# Patient Record
Sex: Female | Born: 1980 | Race: White | Hispanic: No | Smoking: Former smoker
Health system: Southern US, Community
[De-identification: ages and names within clinical notes are randomized; demographics above are authoritative.]

## PROBLEM LIST (undated history)

## (undated) DIAGNOSIS — F988 Other specified behavioral and emotional disorders with onset usually occurring in childhood and adolescence: Secondary | ICD-10-CM

## (undated) DIAGNOSIS — F419 Anxiety disorder, unspecified: Secondary | ICD-10-CM

## (undated) DIAGNOSIS — R87610 Atypical squamous cells of undetermined significance on cytologic smear of cervix (ASC-US): Secondary | ICD-10-CM

## (undated) DIAGNOSIS — J309 Allergic rhinitis, unspecified: Secondary | ICD-10-CM

## (undated) DIAGNOSIS — F329 Major depressive disorder, single episode, unspecified: Secondary | ICD-10-CM

## (undated) DIAGNOSIS — F32A Depression, unspecified: Secondary | ICD-10-CM

## (undated) DIAGNOSIS — T7840XA Allergy, unspecified, initial encounter: Secondary | ICD-10-CM

## (undated) HISTORY — DX: Major depressive disorder, single episode, unspecified: F32.9

## (undated) HISTORY — DX: Anxiety disorder, unspecified: F41.9

## (undated) HISTORY — DX: Atypical squamous cells of undetermined significance on cytologic smear of cervix (ASC-US): R87.610

## (undated) HISTORY — DX: Allergy, unspecified, initial encounter: T78.40XA

## (undated) HISTORY — DX: Allergic rhinitis, unspecified: J30.9

## (undated) HISTORY — DX: Other specified behavioral and emotional disorders with onset usually occurring in childhood and adolescence: F98.8

## (undated) HISTORY — DX: Depression, unspecified: F32.A

---

## 2004-01-01 ENCOUNTER — Ambulatory Visit (HOSPITAL_COMMUNITY): Admission: RE | Admit: 2004-01-01 | Discharge: 2004-01-01 | Payer: Self-pay | Admitting: Gynecology

## 2004-05-19 ENCOUNTER — Inpatient Hospital Stay (HOSPITAL_COMMUNITY): Admission: AD | Admit: 2004-05-19 | Discharge: 2004-05-21 | Payer: Self-pay | Admitting: Gynecology

## 2006-07-19 ENCOUNTER — Ambulatory Visit: Payer: Self-pay | Admitting: Family Medicine

## 2006-07-19 ENCOUNTER — Encounter: Payer: Self-pay | Admitting: Family Medicine

## 2009-08-25 ENCOUNTER — Ambulatory Visit: Payer: Self-pay | Admitting: Family Medicine

## 2009-08-25 ENCOUNTER — Encounter: Payer: Self-pay | Admitting: Obstetrics and Gynecology

## 2009-08-25 LAB — CONVERTED CEMR LAB
Antibody Screen: NEGATIVE
Basophils Absolute: 0 10*3/uL (ref 0.0–0.1)
Basophils Relative: 0 % (ref 0–1)
Eosinophils Absolute: 0 10*3/uL (ref 0.0–0.7)
Eosinophils Relative: 1 % (ref 0–5)
HCT: 37.7 % (ref 36.0–46.0)
Hemoglobin: 12.3 g/dL (ref 12.0–15.0)
Hepatitis B Surface Ag: NEGATIVE
Lymphocytes Relative: 23 % (ref 12–46)
Lymphs Abs: 1.6 10*3/uL (ref 0.7–4.0)
MCHC: 32.6 g/dL (ref 30.0–36.0)
MCV: 92 fL (ref 78.0–100.0)
Monocytes Absolute: 0.4 10*3/uL (ref 0.1–1.0)
Monocytes Relative: 6 % (ref 3–12)
Neutro Abs: 4.8 10*3/uL (ref 1.7–7.7)
Neutrophils Relative %: 70 % (ref 43–77)
Platelets: 287 10*3/uL (ref 150–400)
RBC: 4.1 M/uL (ref 3.87–5.11)
RDW: 14 % (ref 11.5–15.5)
Rh Type: POSITIVE
Rubella: 32.9 intl units/mL — ABNORMAL HIGH
WBC: 7 10*3/uL (ref 4.0–10.5)

## 2009-08-27 ENCOUNTER — Ambulatory Visit (HOSPITAL_COMMUNITY): Admission: RE | Admit: 2009-08-27 | Discharge: 2009-08-27 | Payer: Self-pay | Admitting: Obstetrics and Gynecology

## 2009-09-08 ENCOUNTER — Ambulatory Visit: Payer: Self-pay | Admitting: Obstetrics and Gynecology

## 2009-09-17 ENCOUNTER — Ambulatory Visit (HOSPITAL_COMMUNITY): Admission: RE | Admit: 2009-09-17 | Discharge: 2009-09-17 | Payer: Self-pay | Admitting: Obstetrics and Gynecology

## 2009-10-01 ENCOUNTER — Ambulatory Visit: Payer: Self-pay | Admitting: Family Medicine

## 2009-10-08 ENCOUNTER — Ambulatory Visit (HOSPITAL_COMMUNITY): Admission: RE | Admit: 2009-10-08 | Discharge: 2009-10-08 | Payer: Self-pay | Admitting: Family Medicine

## 2009-11-04 ENCOUNTER — Ambulatory Visit: Payer: Self-pay | Admitting: Family Medicine

## 2009-11-14 ENCOUNTER — Encounter: Admission: RE | Admit: 2009-11-14 | Discharge: 2009-11-14 | Payer: Self-pay | Admitting: Surgery

## 2009-12-02 ENCOUNTER — Ambulatory Visit: Payer: Self-pay | Admitting: Family Medicine

## 2009-12-02 LAB — CONVERTED CEMR LAB
HCT: 32.8 % — ABNORMAL LOW (ref 36.0–46.0)
Hemoglobin: 10.4 g/dL — ABNORMAL LOW (ref 12.0–15.0)
MCHC: 31.7 g/dL (ref 30.0–36.0)
MCV: 94.5 fL (ref 78.0–100.0)
Platelets: 286 10*3/uL (ref 150–400)
RBC: 3.47 M/uL — ABNORMAL LOW (ref 3.87–5.11)
RDW: 14.2 % (ref 11.5–15.5)
WBC: 9.9 10*3/uL (ref 4.0–10.5)

## 2009-12-15 ENCOUNTER — Ambulatory Visit: Payer: Self-pay | Admitting: Obstetrics & Gynecology

## 2009-12-25 ENCOUNTER — Ambulatory Visit: Payer: Self-pay | Admitting: Obstetrics & Gynecology

## 2010-01-08 ENCOUNTER — Ambulatory Visit: Payer: Self-pay | Admitting: Obstetrics & Gynecology

## 2010-01-27 ENCOUNTER — Ambulatory Visit: Payer: Self-pay | Admitting: Obstetrics & Gynecology

## 2010-02-17 ENCOUNTER — Encounter: Payer: Self-pay | Admitting: Family Medicine

## 2010-02-17 ENCOUNTER — Ambulatory Visit: Payer: Self-pay | Admitting: Family Medicine

## 2010-02-17 LAB — CONVERTED CEMR LAB
Chlamydia, DNA Probe: NEGATIVE
GC Probe Amp, Genital: NEGATIVE

## 2010-02-18 ENCOUNTER — Encounter: Payer: Self-pay | Admitting: Family Medicine

## 2010-02-25 ENCOUNTER — Ambulatory Visit: Payer: Self-pay | Admitting: Obstetrics & Gynecology

## 2010-02-27 ENCOUNTER — Ambulatory Visit
Admission: RE | Admit: 2010-02-27 | Discharge: 2010-02-27 | Payer: Self-pay | Source: Home / Self Care | Attending: Obstetrics & Gynecology | Admitting: Obstetrics & Gynecology

## 2010-03-03 ENCOUNTER — Encounter: Payer: Self-pay | Admitting: Obstetrics & Gynecology

## 2010-03-03 ENCOUNTER — Ambulatory Visit
Admission: RE | Admit: 2010-03-03 | Discharge: 2010-03-03 | Payer: Self-pay | Source: Home / Self Care | Attending: Family Medicine | Admitting: Family Medicine

## 2010-03-03 LAB — CONVERTED CEMR LAB
ALT: 13 units/L (ref 0–35)
AST: 19 units/L (ref 0–37)
Albumin: 3.3 g/dL — ABNORMAL LOW (ref 3.5–5.2)
Alkaline Phosphatase: 102 units/L (ref 39–117)
BUN: 8 mg/dL (ref 6–23)
CO2: 23 meq/L (ref 19–32)
Calcium: 9.2 mg/dL (ref 8.4–10.5)
Chloride: 105 meq/L (ref 96–112)
Collection Interval-CRCL: 24 hr
Creatinine 24 HR UR: 1294 mg/24hr (ref 700–1800)
Creatinine Clearance: 145 mL/min — ABNORMAL HIGH (ref 75–115)
Creatinine, Ser: 0.62 mg/dL (ref 0.40–1.20)
Creatinine, Urine: 64.7 mg/dL
Glucose, Bld: 68 mg/dL — ABNORMAL LOW (ref 70–99)
HCT: 39.2 % (ref 36.0–46.0)
Hemoglobin: 11.6 g/dL — ABNORMAL LOW (ref 12.0–15.0)
LDH: 158 units/L (ref 94–250)
MCHC: 29.6 g/dL — ABNORMAL LOW (ref 30.0–36.0)
MCV: 104 fL — ABNORMAL HIGH (ref 78.0–100.0)
Platelets: 183 10*3/uL (ref 150–400)
Potassium: 4.5 meq/L (ref 3.5–5.3)
Protein, 24H Urine: 160 mg/24hr — ABNORMAL HIGH (ref 50–100)
RBC: 3.77 M/uL — ABNORMAL LOW (ref 3.87–5.11)
RDW: 15.2 % (ref 11.5–15.5)
Sodium: 137 meq/L (ref 135–145)
Total Bilirubin: 0.3 mg/dL (ref 0.3–1.2)
Total Protein: 6 g/dL (ref 6.0–8.3)
Uric Acid, Serum: 5.4 mg/dL (ref 2.4–7.0)
WBC: 8 10*3/uL (ref 4.0–10.5)

## 2010-03-10 ENCOUNTER — Ambulatory Visit: Admit: 2010-03-10 | Payer: Self-pay | Admitting: Family Medicine

## 2010-03-10 ENCOUNTER — Inpatient Hospital Stay (HOSPITAL_COMMUNITY)
Admission: AD | Admit: 2010-03-10 | Discharge: 2010-03-12 | Payer: Self-pay | Source: Home / Self Care | Attending: Obstetrics & Gynecology | Admitting: Obstetrics & Gynecology

## 2010-03-16 LAB — CBC
HCT: 36.3 % (ref 36.0–46.0)
HCT: 34.2 % — ABNORMAL LOW (ref 36.0–46.0)
Hemoglobin: 12.2 g/dL (ref 12.0–15.0)
Hemoglobin: 11.1 g/dL — ABNORMAL LOW (ref 12.0–15.0)
MCH: 30.7 pg (ref 26.0–34.0)
MCH: 30.7 pg (ref 26.0–34.0)
MCHC: 33.6 g/dL (ref 30.0–36.0)
MCHC: 32.5 g/dL (ref 30.0–36.0)
MCV: 91.2 fL (ref 78.0–100.0)
MCV: 94.7 fL (ref 78.0–100.0)
Platelets: 182 10*3/uL (ref 150–400)
Platelets: 157 10*3/uL (ref 150–400)
RBC: 3.98 MIL/uL (ref 3.87–5.11)
RBC: 3.61 MIL/uL — ABNORMAL LOW (ref 3.87–5.11)
RDW: 14 % (ref 11.5–15.5)
RDW: 14.1 % (ref 11.5–15.5)
WBC: 9 10*3/uL (ref 4.0–10.5)
WBC: 10.1 10*3/uL (ref 4.0–10.5)

## 2010-03-16 LAB — RPR: RPR Ser Ql: NONREACTIVE

## 2010-04-15 ENCOUNTER — Ambulatory Visit: Payer: Self-pay | Admitting: Obstetrics & Gynecology

## 2010-05-12 ENCOUNTER — Ambulatory Visit: Payer: Self-pay | Admitting: Obstetrics and Gynecology

## 2010-05-13 ENCOUNTER — Ambulatory Visit: Payer: BC Managed Care – PPO | Admitting: Obstetrics & Gynecology

## 2010-05-13 DIAGNOSIS — Z30430 Encounter for insertion of intrauterine contraceptive device: Secondary | ICD-10-CM

## 2010-05-22 NOTE — Assessment & Plan Note (Signed)
NAMERAKAYLA, RICKLEFS NO.:  [id number]  MEDICAL RECORD NO.:  [id number]           PATIENT TYPE:  LOCATION:  [facility] at [facility]           FACILITY:  PHYSICIAN:  Allie Bossier, MD        DATE OF BIRTH:  Mar 31, 1980  DATE OF SERVICE:  05/12/2010                                 CLINIC NOTE  Ms. Prentiss is a 30 year old G3, P3, now status post NSVD 9 weeks ago on March 10, 2010.  The baby is doing well.  She denies any symptoms of baby blues or depression.  She has not yet had sex.  For birth control, she is scheduled to get a Mirena IUD tomorrow.  She says that regarding her bladder for approximately 4 weeks postpartum had some stress incontinence, but now that is resolved.  She is currently doing Kegel exercises.  She says initially bowel function was complicated by constipation, but that is now resolved.  She is currently not taking any medication.  She has no complaints.  On exam, her vulva is well healed. Her bimanual exam reveals normal size and shape, anteverted, mobile uterus.  Her adnexa are nonenlarged and nontender.  ASSESSMENT AND PLAN:  Postpartum exam, doing well.  She will plan to get her Mirena tomorrow, and her Pap smear will be due again in October 2012.     Allie Bossier, MD    MCD/MEDQ  D:  05/12/2010  T:  05/13/2010  Job:  [id number]

## 2010-07-14 NOTE — Assessment & Plan Note (Signed)
NAME:  Stephanie Kane, Stephanie Kane      ACCOUNT NO.:  [id number]   MEDICAL RECORD NO.:  [id number]          PATIENT TYPE:  POB   LOCATION:  [facility] at [facility]         FACILITY:  [facility]   PHYSICIAN:  Tinnie Gens, MD        DATE OF BIRTH:  Mar 25, 1980   DATE OF SERVICE:  07/19/2006                                  CLINIC NOTE   CHIEF COMPLAINT:  The patient presents today for her annual exam.   HISTORY OF PRESENT ILLNESS:  The patient is a 30 year old female who has  no complaints today.  She does wish to have her Allegra prescription  refilled that she takes daily for her allergies, and she is interested  in returning to oral contraception.  She states that she had been on the  pill before, Ortho Tri-Cyclen, and developed headaches and she wishes to  be back home because her face is now breaking out and it was much better  on pills.   PAST MEDICAL HISTORY:  Past medical history is negative.   FAMILY MEDICAL HISTORY:  Maternal grandmother and maternal grandfather  have hypertension and treated with medications.   SOCIAL HISTORY:  [employer] patient is working in the [employer].  She is  a nonsmoker.  She drinks occasionally wine, and denies recreational drug  use.  She is married.   SURGICAL HISTORY:  Denies.   GYNECOLOGICAL HISTORY:  Menarche at age 89.  She has had a history of  abnormal genital cytology x2, approximately 7 to 8 years ago.  She  states that they thought one was because of bacterial vaginosis.  She  describes always having a vaginal discharge; however, it is not  associated with a foul odor.  She is gravida 1, para 2.  Her cycles are  every 28 days, lasting 4 to 5 days.  She also uses condoms for birth  control, but wishes to return to pills.   ALLERGIES:  SHE HAS NO ALLERGIES TO FOOD OR MEDICINES.   REVIEW OF SYSTEMS:  1. Positive for allergic rhinitis.  2. Acne, which she feels has worsened since going off birth control      pills.   PHYSICAL EXAMINATION:   GENERAL:  The patient is a well-developed, well-  nourished female in no acute distress.  VITAL SIGNS:  Pulse 92, blood pressure 133/73, her weight is 147.  Her  last menstrual period was 07/07/2006.  Her last Pap smear was  06/30/2004.  HEENT:  Negative.  CHEST:  Clear to auscultation and percussion.  HEART:  Rate and rhythm were regular, without murmur, gallop, or cardiac  enlargement.  BREASTS:  Bilaterally soft, without masses, nodes, or nipple discharge.  ABDOMEN:  Abdomen is soft and scaphoid, without masses or organomegaly.  PELVIC EXAMINATION:  External genitalia within normal limits for female.  The vagina is clean and rugose.  There is no evidence of abnormal  vaginal discharge at this time.  The cervix is parous and nontender.  The uterus is firm and normal shape, size, and contour.  Adnexa  bilaterally clear.  RECTOVAGINAL EXAMINATION:  Deferred.  EXTREMITIES:  Within normal limits.   IMPRESSION:  1. Normal gynecologic examination.  2. Allergic rhinitis, treated with Allegra.  3.  The patient's desire to return to oral contraception.   PLAN:  1. I have prescribed Alesse 120/28 mcg, one p.o. daily, one pack and      p.r.n. refills.  Have chosen this due to the lower estrogen to      hopefully avoid headaches.  The patient is to report any headaches      related to this pill.  2. Allegra D one p.o. b.i.d. p.r.n. for allergic rhinitis, dispensed      #60.  3. Pap smear results will be back in 1 to 2 weeks.  Will call patient      if the Pap smear is abnormal.  4. She is to return to the office in one year for her annual      examination.     ______________________________  Matt Holmes, N.P.    ______________________________  Tinnie Gens, MD    EMK/MEDQ  D:  07/19/2006  T:  07/19/2006  Job:  [id number]

## 2011-10-11 ENCOUNTER — Ambulatory Visit: Payer: Self-pay | Admitting: Family Medicine

## 2012-05-25 IMAGING — US US OB DETAIL+14 WK
1 series · 14 of 28 positions shown · non-contrast
Comparison: none

OBSTETRICAL ULTRASOUND:
 This ultrasound exam was performed in the [HOSPITAL] Ultrasound Department.  The OB US report was generated in the AS system, and faxed to the ordering physician.  This report is also available in [HOSPITAL]?s [employer] and in [REDACTED] PACS.

[Series 1: us ob detail +14 wk · 100 acquisitions, 14 frames shown]
[im 4/100]
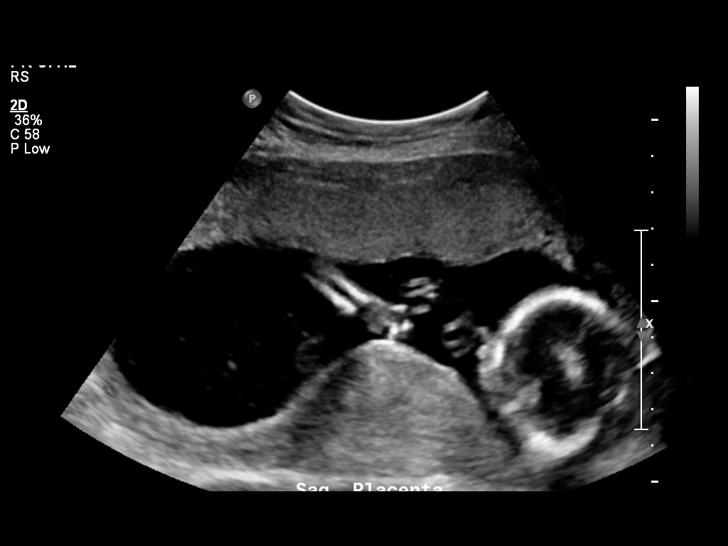
[im 12/100]
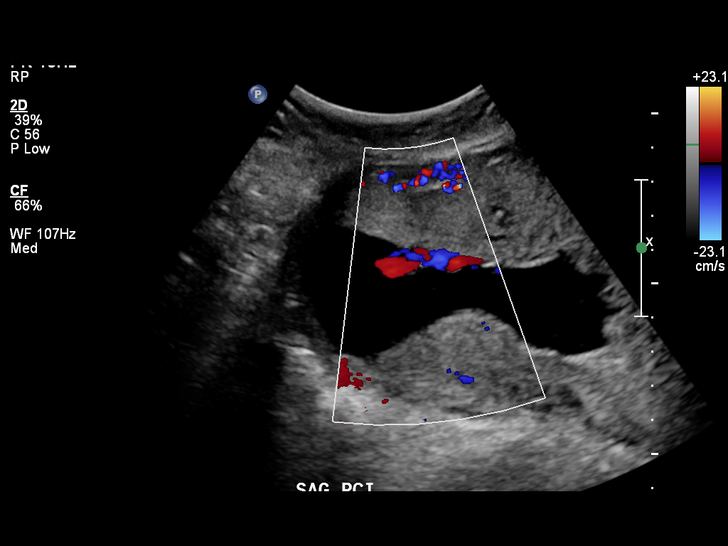
[im 19/100]
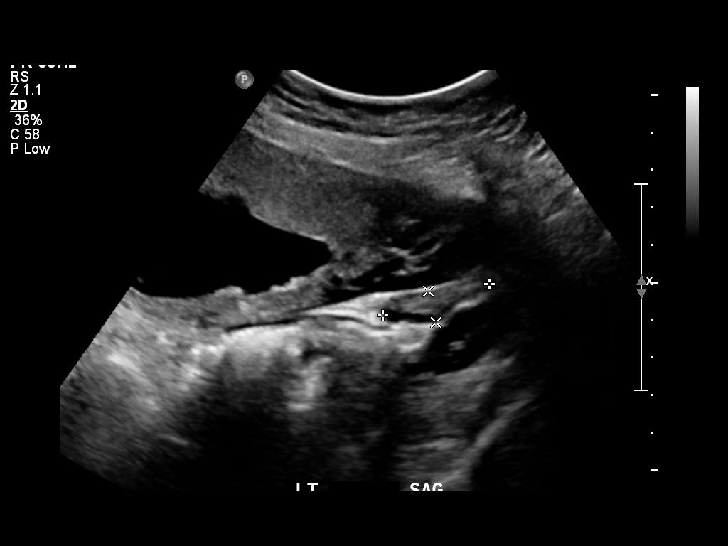
[im 26/100]
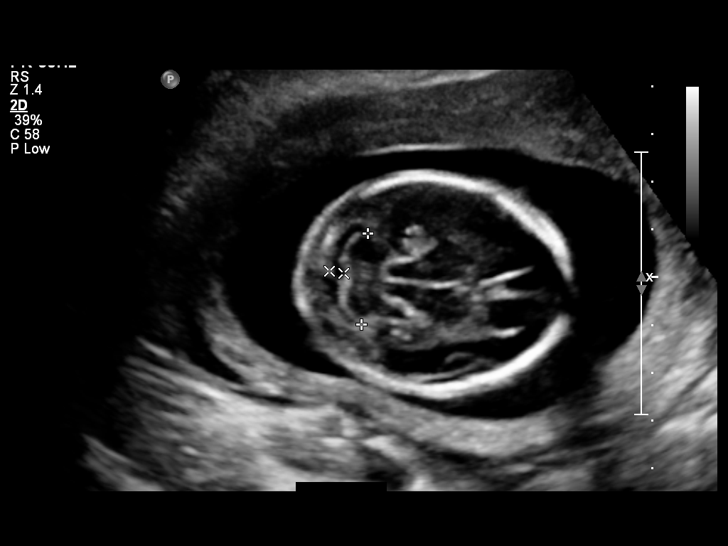
[im 34/100]
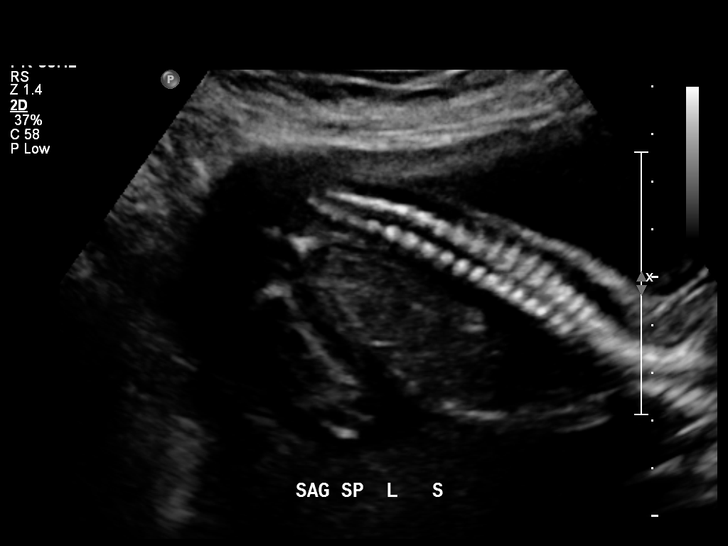
[im 41/100]
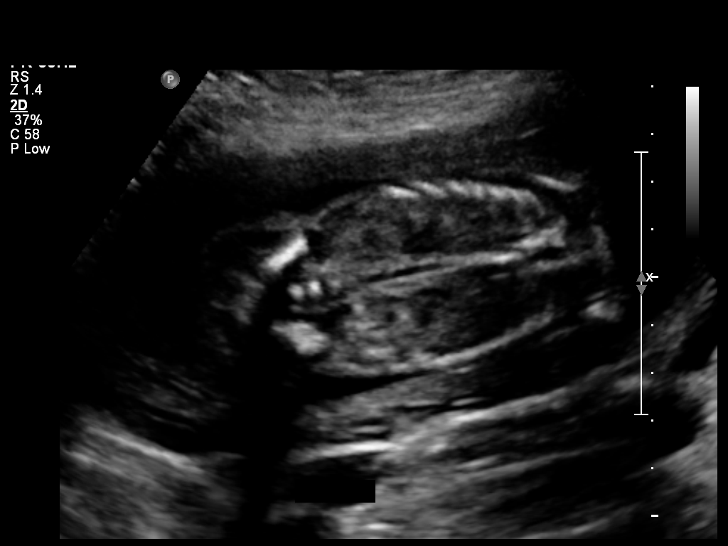
[im 48/100]
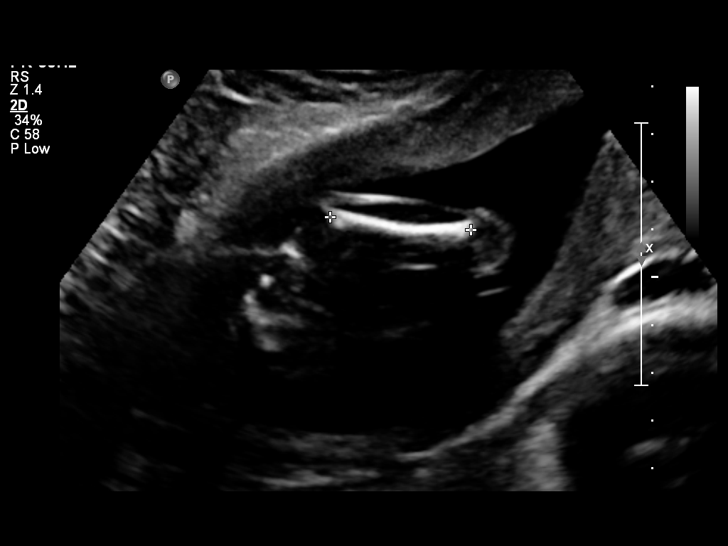
[im 56/100]
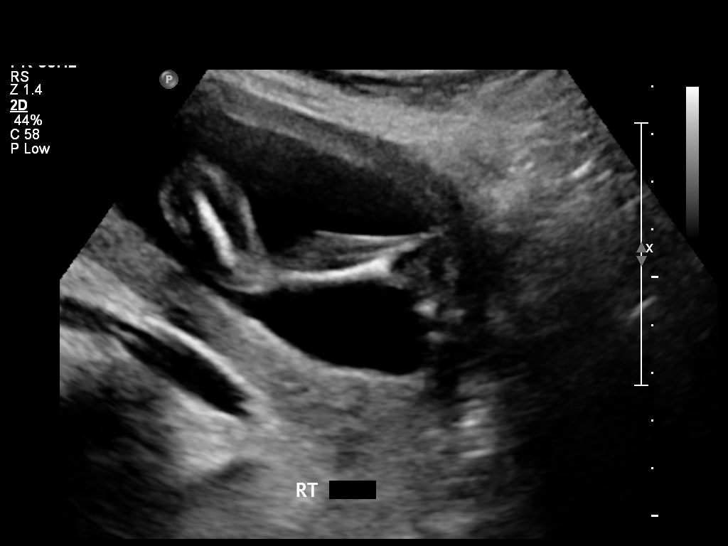
[im 63/100]
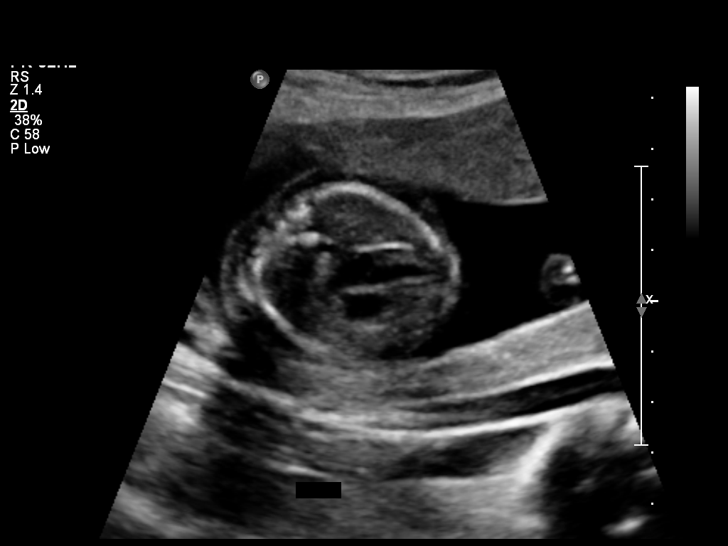
[im 70/100]
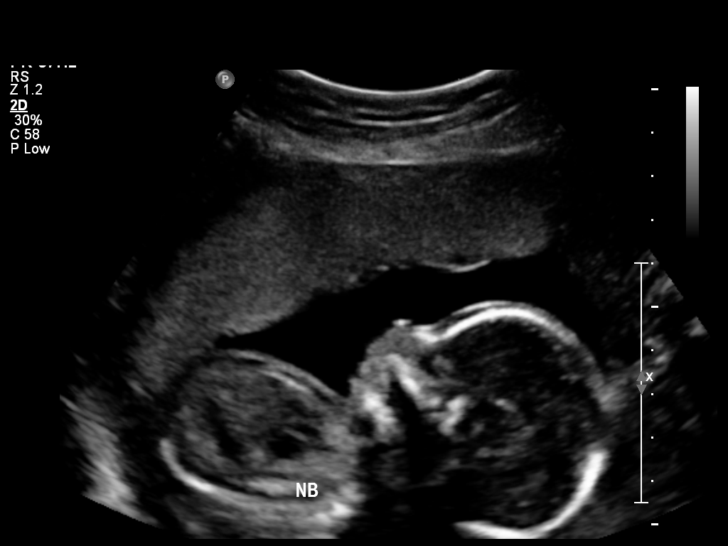
[im 78/100]
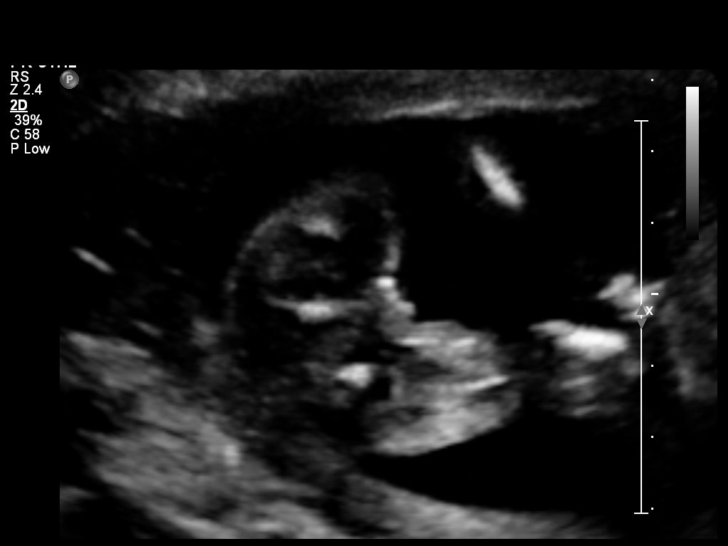
[im 85/100]
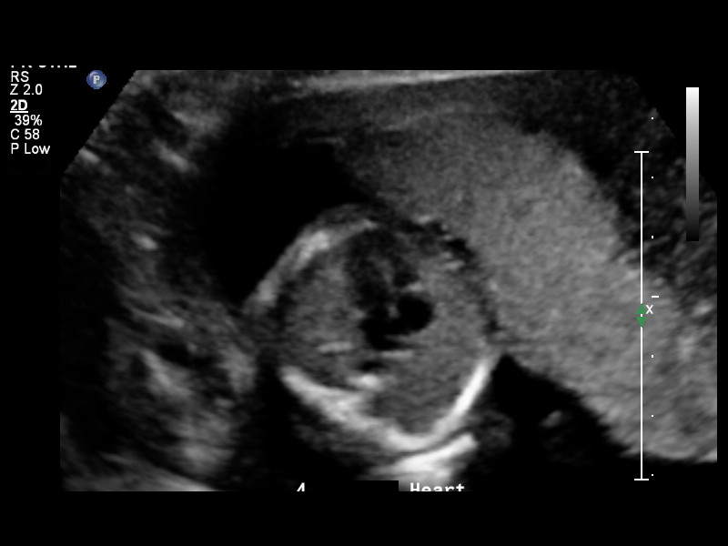
[im 92/100]
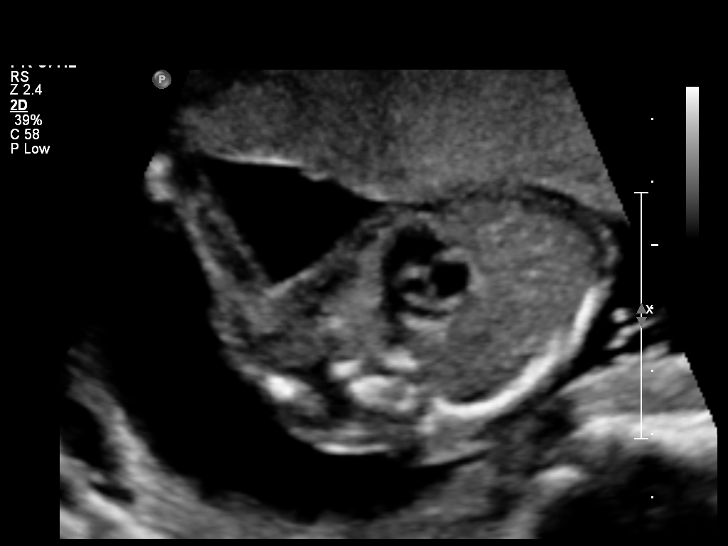
[im 100/100]
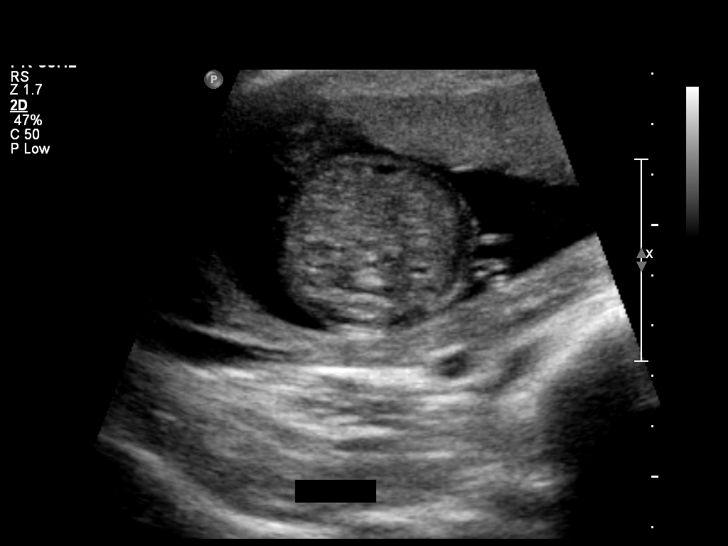

[14 of 28 positions shown; findings below may reference images not displayed]

IMPRESSION: See AS Obstetric US report.

## 2012-07-01 IMAGING — US US OUTSIDE FILMS BREAST
1 series · 4 of 4 positions shown · non-contrast
Comparison: none

[Series 1: us outside films breast · 4 of 4 slices shown]
[im 1/4]
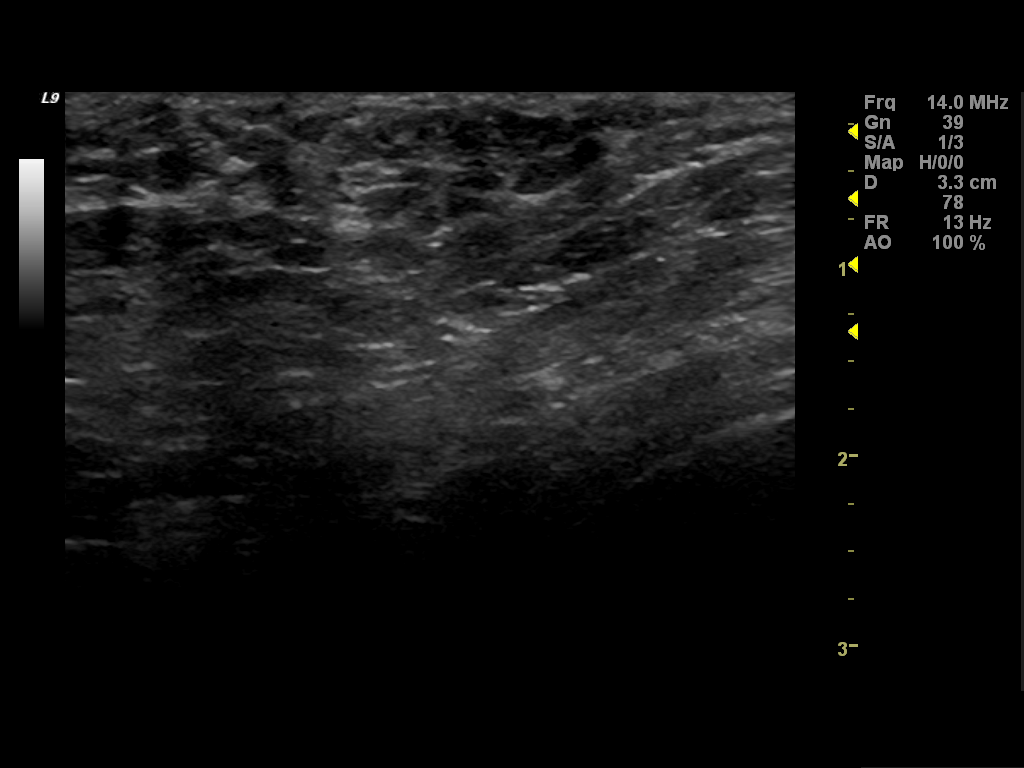
[im 2/4]
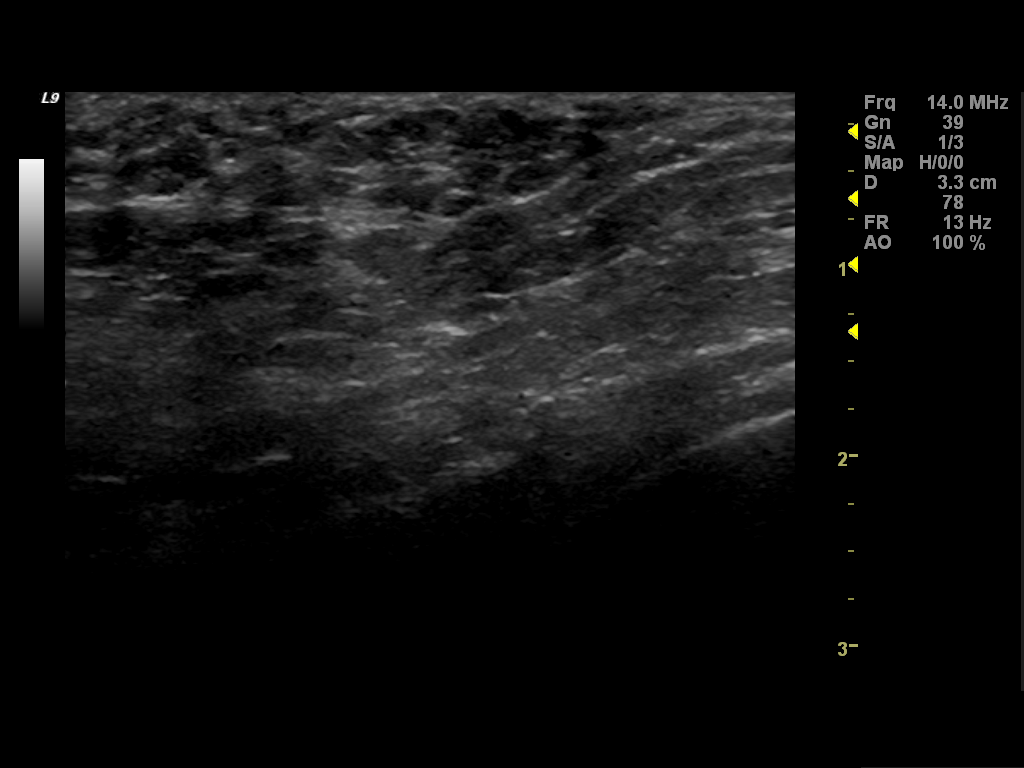
[im 3/4]
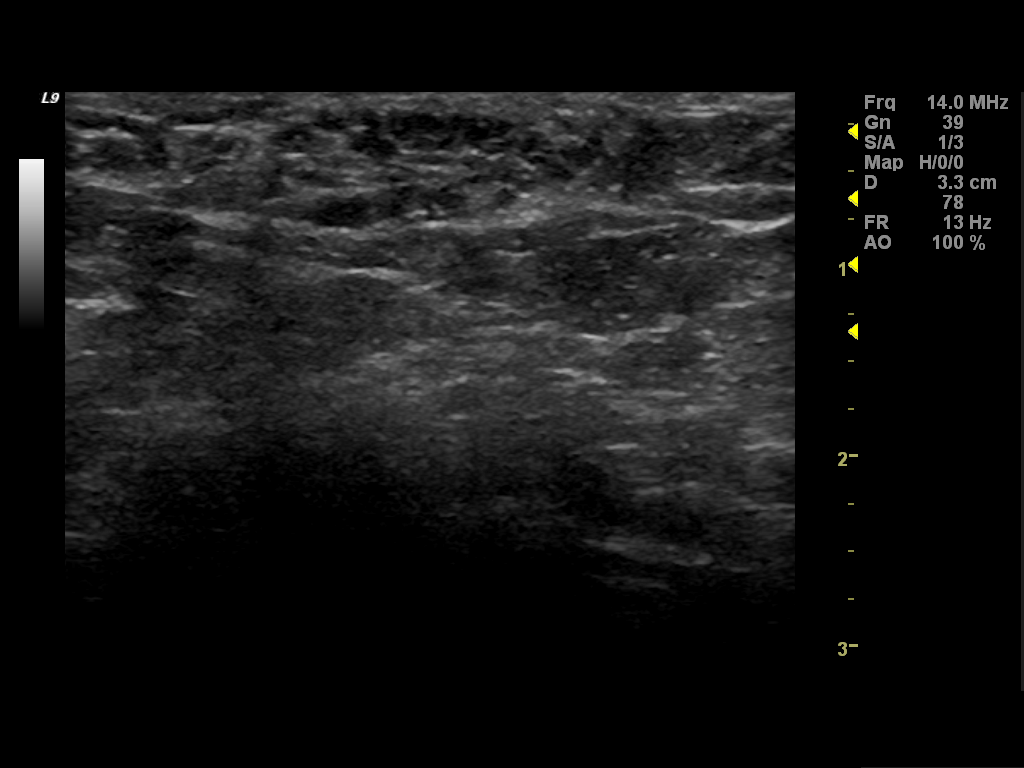
[im 4/4]
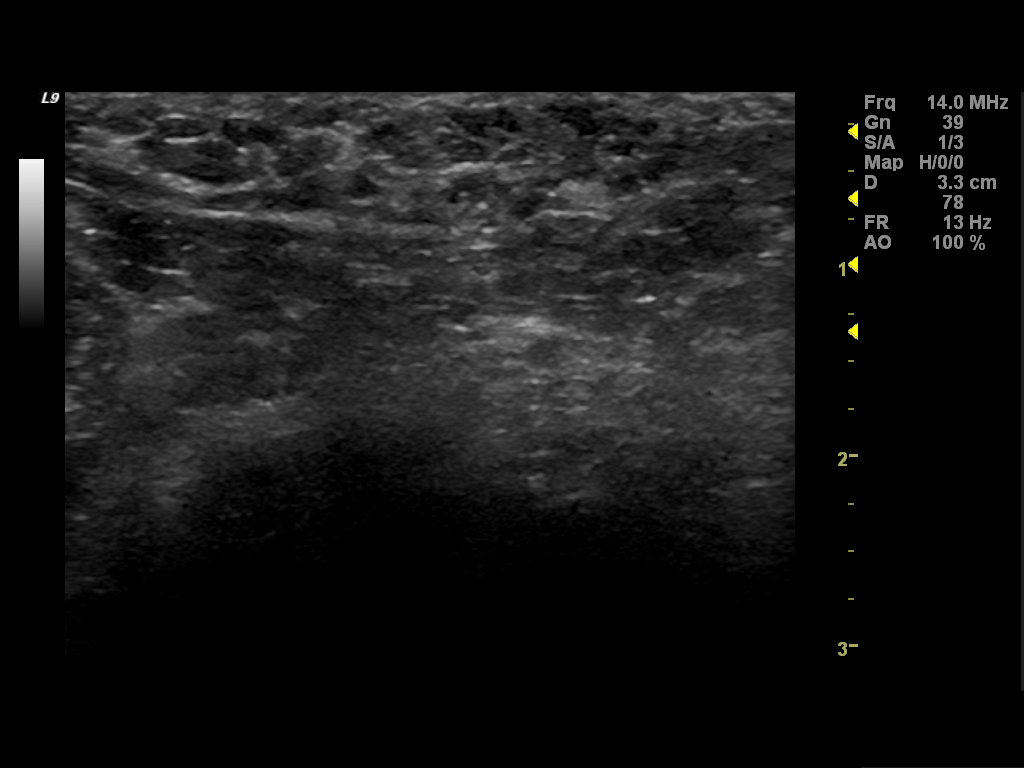

[4 of 4 positions shown; findings below may reference images not displayed]

IMAGES IMPORTED FROM THE [employer] WORKFLOW SYSTEM
NO DICTATION FOR STUDY

## 2013-03-07 ENCOUNTER — Ambulatory Visit: Payer: Self-pay | Admitting: Family Medicine

## 2014-05-28 IMAGING — US ULTRASOUND LEFT BREAST
1 series · 14 of 25 positions shown · non-contrast
Comparison: none

REASON FOR EXAM: LT BRST LUMP 2 OCLOCK AND ENLARGED AXILLARY LYMPH
BASELINE
COMMENTS:

[Series 1: ultrasound left breast · 0.08mm/px · 14 of 26 slices shown]
[im 1/26]
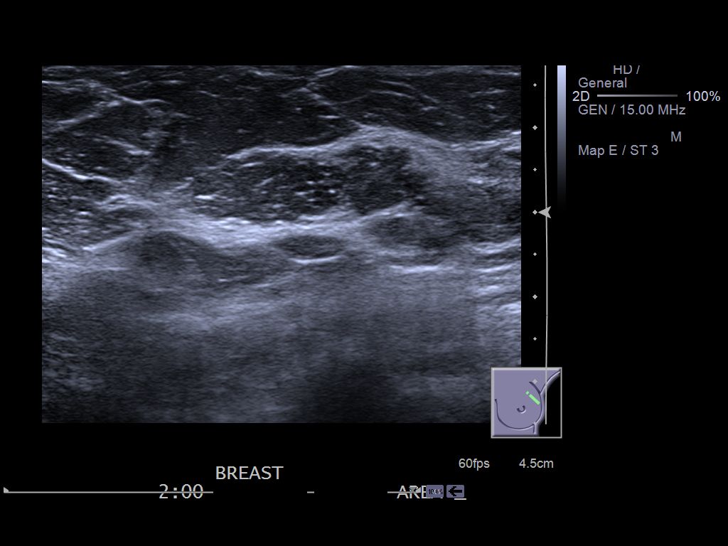
[im 3/26]
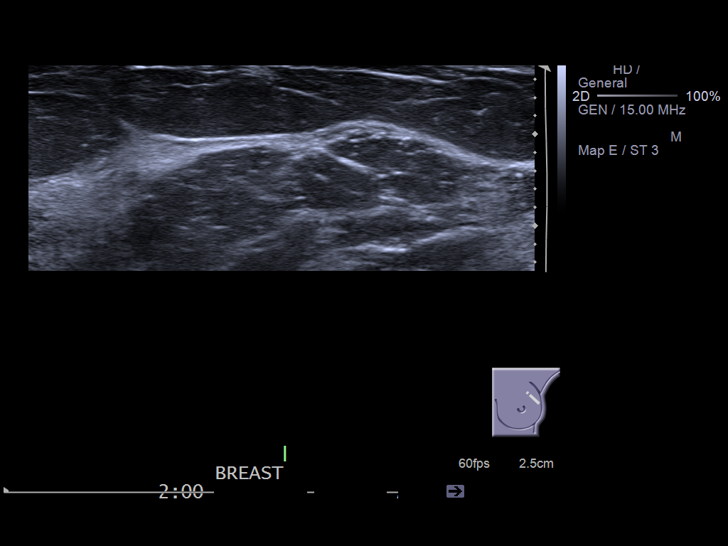
[im 5/26]
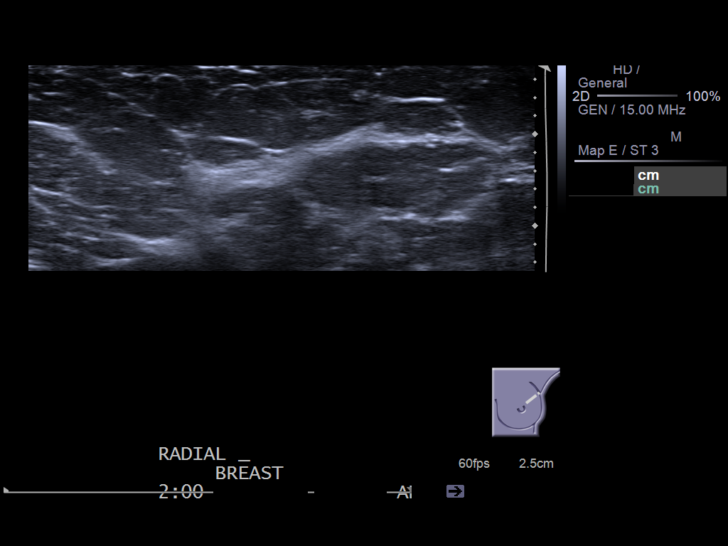
[im 7/26]
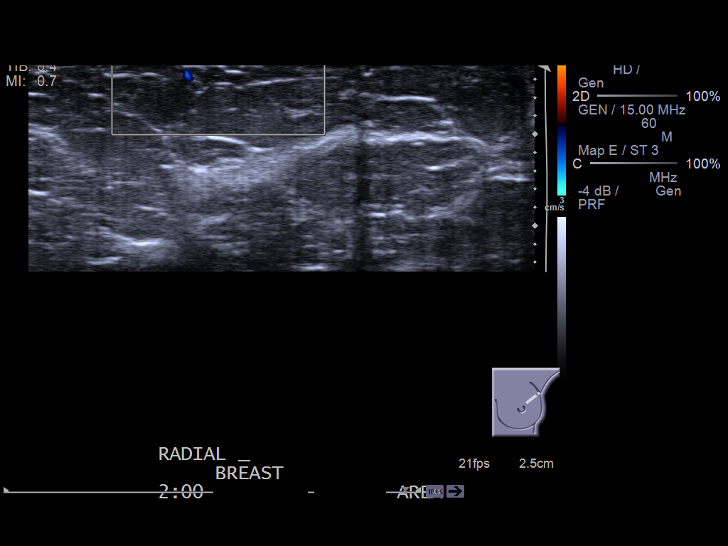
[im 9/26]
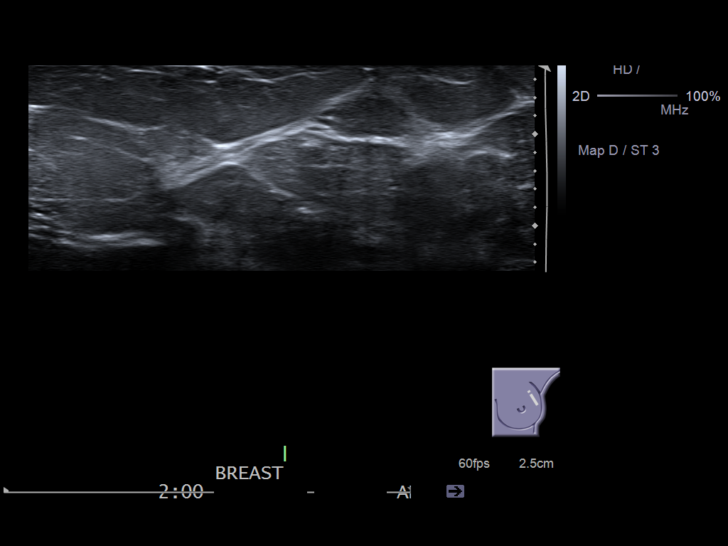
[im 10/26]
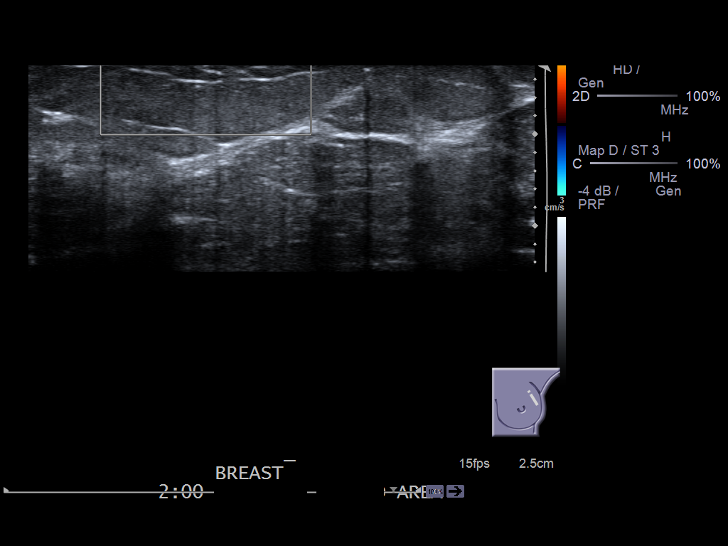
[im 12/26]
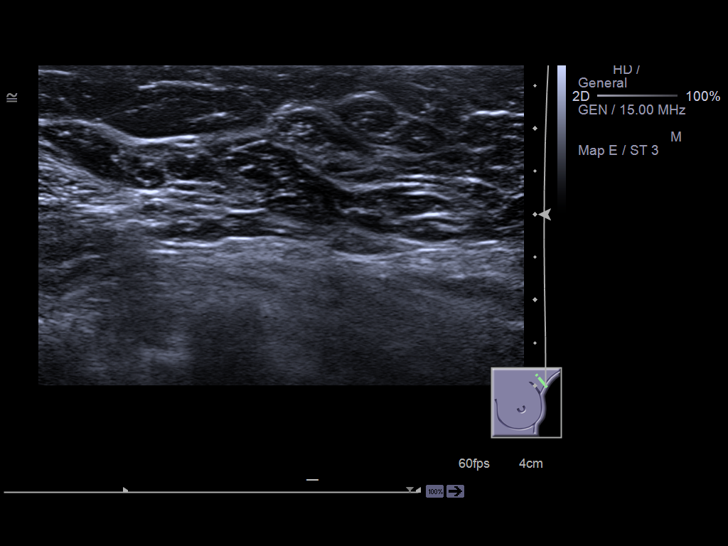
[im 14/26]
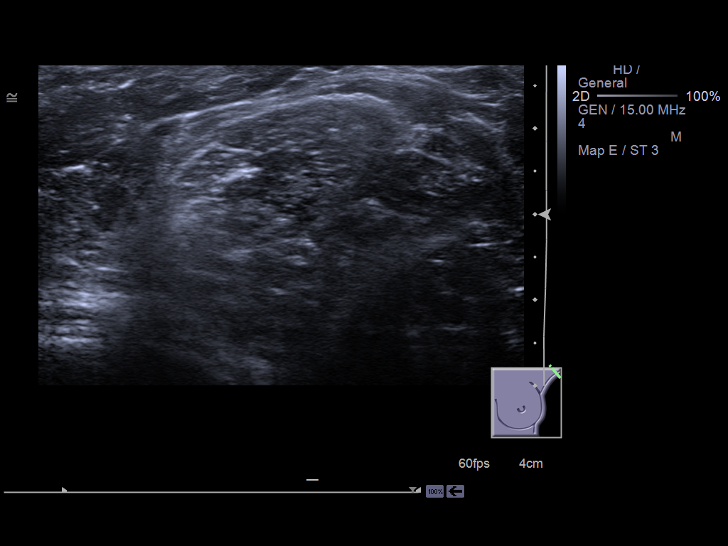
[im 16/26]
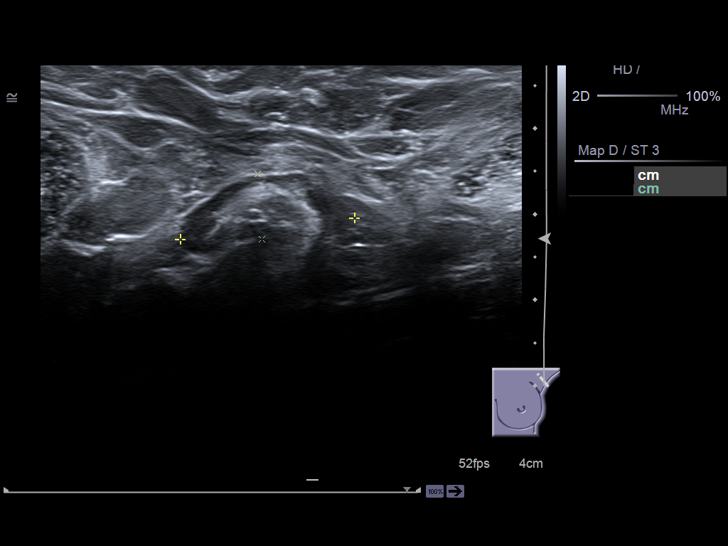
[im 17/26]
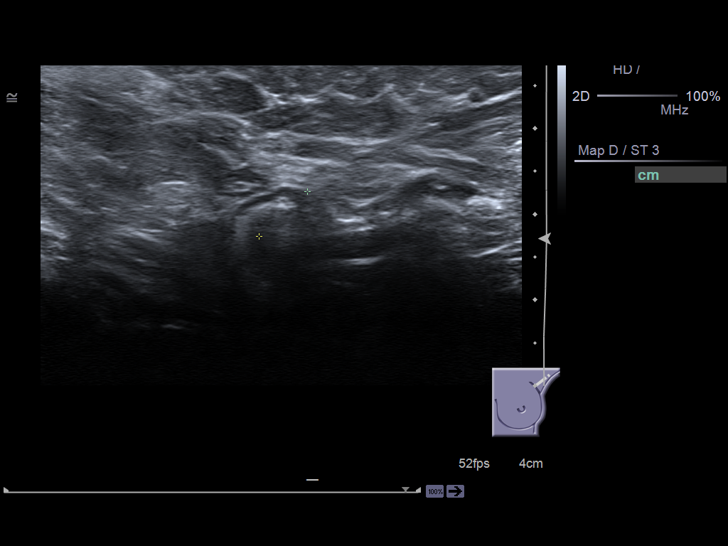
[im 19/26]
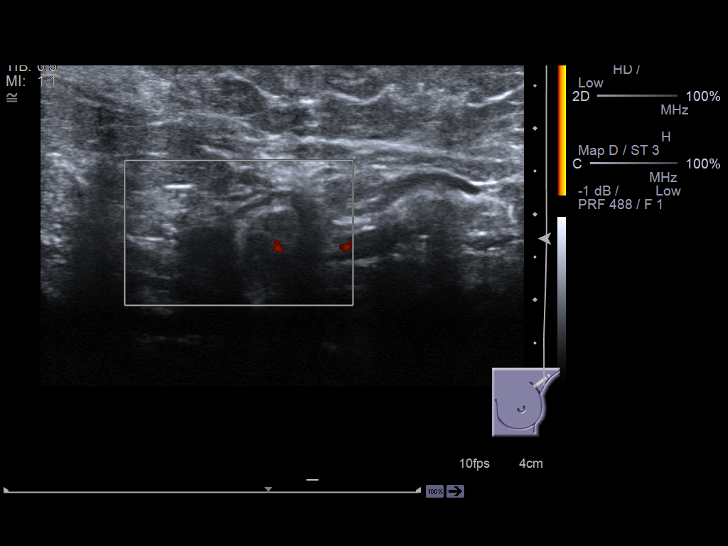
[im 21/26]
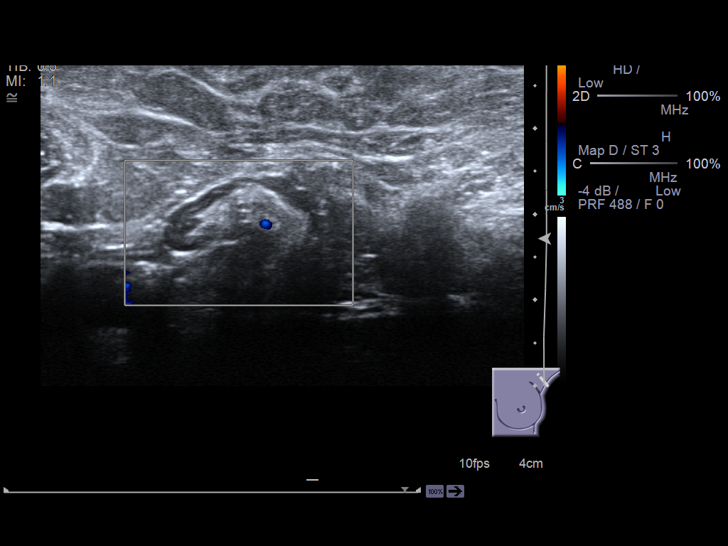
[im 23/26]
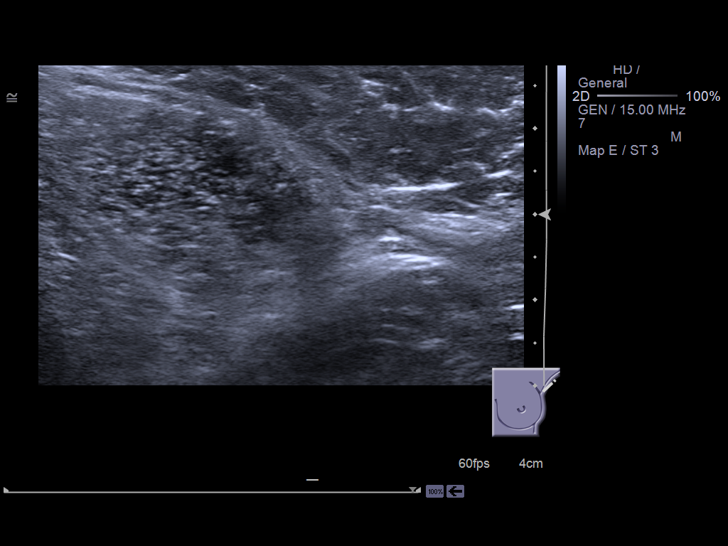
[im 26/26]
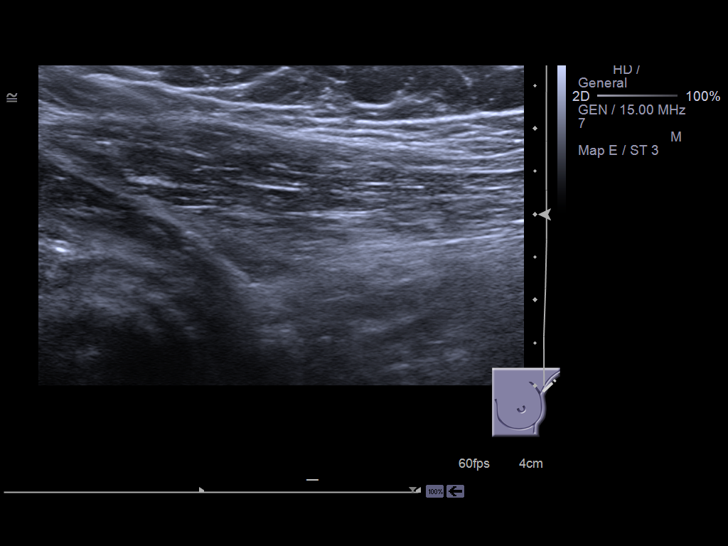

[14 of 25 positions shown; findings below may reference images not displayed]

PROCEDURE:     US  - US BREAST LEFT  - October 11, 2011  [DATE]

RESULT:     There is a history of palpable area of abnormality in the 2
o'clock region of the left breast with a prominent lymph node in the left
axillary region palpable. Targeted ultrasound shows a very superficial
dermal area of decreased echogenicity measuring 0.33 x 0.17 x 0.22 cm and a
hypoechoic area with hyperechogenic center in the axillary region measuring
2.05 x 0.76 x 0.77 cm likely consistent with a lymph node. No definite
evidence of malignancy is demonstrated.
IMPRESSION: 1.Please see above.

Dictation Site:1

## 2014-06-05 ENCOUNTER — Ambulatory Visit
Admit: 2014-06-05 | Disposition: A | Discharge status: Home / Self Care | Payer: Self-pay | Attending: Family Medicine | Admitting: Family Medicine

## 2014-09-18 ENCOUNTER — Other Ambulatory Visit: Payer: Self-pay | Admitting: Family Medicine

## 2014-09-18 NOTE — Telephone Encounter (Signed)
Routing to provider  

## 2014-11-19 ENCOUNTER — Other Ambulatory Visit: Payer: Self-pay

## 2014-11-19 MED ORDER — BUPROPION HCL ER (XL) 150 MG PO TB24: 150.0000 mg | ORAL_TABLET | Freq: Three times a day (TID) | ORAL | Status: DC

## 2014-11-19 NOTE — Telephone Encounter (Signed)
She would like a refill on her Wellbutrin.

## 2014-11-19 NOTE — Telephone Encounter (Signed)
I spoke patient, she would like to get refills if possible.

## 2015-01-07 ENCOUNTER — Ambulatory Visit (INDEPENDENT_AMBULATORY_CARE_PROVIDER_SITE_OTHER): Payer: BLUE CROSS/BLUE SHIELD | Admitting: Family Medicine

## 2015-01-07 ENCOUNTER — Encounter: Payer: Self-pay | Admitting: Family Medicine

## 2015-01-07 VITALS — BP 111/74 | HR 98 | Temp 98.9°F | Ht 63.6 in | Wt 165.0 lb

## 2015-01-07 DIAGNOSIS — F32A Depression, unspecified: Secondary | ICD-10-CM

## 2015-01-07 DIAGNOSIS — R002 Palpitations: Secondary | ICD-10-CM

## 2015-01-07 DIAGNOSIS — F329 Major depressive disorder, single episode, unspecified: Secondary | ICD-10-CM | POA: Diagnosis not present

## 2015-01-07 DIAGNOSIS — F419 Anxiety disorder, unspecified: Secondary | ICD-10-CM | POA: Insufficient documentation

## 2015-01-07 MED ORDER — FLUOXETINE HCL 20 MG PO TABS: 20.0000 mg | ORAL_TABLET | Freq: Every day | ORAL | Status: DC

## 2015-01-07 NOTE — Assessment & Plan Note (Signed)
Ongoing chronic anxiety and on further review patient may be developing panic attacks.

## 2015-01-07 NOTE — Progress Notes (Signed)
BP 111/74 mmHg  Pulse 98  Temp(Src) 98.9 F (37.2 C)  Ht 5' 3.6" (1.615 m)  Wt 165 lb (74.844 kg)  BMI 28.70 kg/m2  SpO2 99%  LMP 12/24/2014 (Approximate)   Subjective:    Patient ID: Stephanie AxonKelly J Stensland, female    DOB: [birth year], 34 y.o.   MRN: [id number]  HPI: Stephanie Kane is a 34 y.o. female  Chief Complaint  Patient presents with  . Anxiety   patient with chronic anxiety and having what sounds like panic attacks. Has been on bupropion for years and getting worse BuSpar helps a little bit And hasn't really taken lorazepam. Patient having marked palpitations with rapid heartbeat and marked cardiovascular concerns To the point where she thought about going to the emergency room  Relevant past medical, surgical, family and social history reviewed and updated as indicated. Interim medical history since our last visit reviewed. Allergies and medications reviewed and updated.  Review of Systems  Constitutional: Negative.   Respiratory: Negative.   Cardiovascular: Positive for palpitations.    Per HPI unless specifically indicated above     Objective:    BP 111/74 mmHg  Pulse 98  Temp(Src) 98.9 F (37.2 C)  Ht 5' 3.6" (1.615 m)  Wt 165 lb (74.844 kg)  BMI 28.70 kg/m2  SpO2 99%  LMP 12/24/2014 (Approximate)  Wt Readings from Last 3 Encounters:  01/07/15 165 lb (74.844 kg)  06/18/14 158 lb (71.668 kg)    Physical Exam  Constitutional: She is oriented to person, place, and time. She appears well-developed and well-nourished. No distress.  HENT:  Head: Normocephalic and atraumatic.  Right Ear: Hearing normal.  Left Ear: Hearing normal.  Nose: Nose normal.  Eyes: Conjunctivae and lids are normal. Right eye exhibits no discharge. Left eye exhibits no discharge. No scleral icterus.  Cardiovascular: Normal rate, regular rhythm and normal heart sounds.   Pulmonary/Chest: Effort normal and breath sounds normal. No respiratory distress.  Musculoskeletal: Normal  range of motion.  Neurological: She is alert and oriented to person, place, and time.  Skin: Skin is intact. No rash noted.  Psychiatric: She has a normal mood and affect. Her speech is normal and behavior is normal. Judgment and thought content normal. Cognition and memory are normal.        Assessment & Plan:   Problem List Items Addressed This Visit      Other   Palpitations - Primary    Reviewed EKG which is normal Reassured patient no cardiac concerns.      Relevant Orders   EKG 12-Lead (Completed)   Depression    Chronic depression with anxiety possibly panic attacks Bupropion may possibly be contributing to anxiety as a side effect so will discontinue We'll start fluoxetine 20 mg 1 a day and see for help with anxiety and/or treat panic. Discuss transition may need to take half a Wellbutrin if having nausea and headaches. Discussed possibility of triggering bipolar mania Will observe patient's symptoms Recheck 2 weeks Reassured patient she wouldn't be much better at that point more side effect visit and to evaluate transition and medications       Relevant Medications   FLUoxetine (PROZAC) 20 MG tablet   Anxiety    Ongoing chronic anxiety and on further review patient may be developing panic attacks.       Relevant Medications   FLUoxetine (PROZAC) 20 MG tablet       Follow up plan: Return in about 2 weeks (around 01/21/2015).

## 2015-01-07 NOTE — Assessment & Plan Note (Signed)
Reviewed EKG which is normal Reassured patient no cardiac concerns.

## 2015-01-07 NOTE — Assessment & Plan Note (Addendum)
Chronic depression with anxiety possibly panic attacks Bupropion may possibly be contributing to anxiety as a side effect so will discontinue We'll start fluoxetine 20 mg 1 a day and see for help with anxiety and/or treat panic. Discuss transition may need to take half a Wellbutrin if having nausea and headaches. Discussed possibility of triggering bipolar mania Will observe patient's symptoms Recheck 2 weeks Reassured patient she wouldn't be much better at that point more side effect visit and to evaluate transition and medications

## 2015-01-22 ENCOUNTER — Ambulatory Visit: Payer: BLUE CROSS/BLUE SHIELD | Admitting: Family Medicine

## 2015-02-28 ENCOUNTER — Encounter: Payer: Self-pay | Admitting: Family Medicine

## 2015-02-28 ENCOUNTER — Ambulatory Visit (INDEPENDENT_AMBULATORY_CARE_PROVIDER_SITE_OTHER): Payer: BLUE CROSS/BLUE SHIELD | Admitting: Family Medicine

## 2015-02-28 VITALS — BP 106/70 | HR 77 | Temp 97.1°F | Wt 172.0 lb

## 2015-02-28 DIAGNOSIS — R7989 Other specified abnormal findings of blood chemistry: Secondary | ICD-10-CM | POA: Diagnosis not present

## 2015-02-28 DIAGNOSIS — Z975 Presence of (intrauterine) contraceptive device: Secondary | ICD-10-CM | POA: Diagnosis not present

## 2015-02-28 DIAGNOSIS — F419 Anxiety disorder, unspecified: Secondary | ICD-10-CM | POA: Diagnosis not present

## 2015-02-28 DIAGNOSIS — R5383 Other fatigue: Secondary | ICD-10-CM | POA: Insufficient documentation

## 2015-02-28 MED ORDER — FLUOXETINE HCL 40 MG PO CAPS: 40.0000 mg | ORAL_CAPSULE | Freq: Every day | ORAL | Status: DC

## 2015-02-28 NOTE — Patient Instructions (Addendum)
See the Psychology Today website to find a counselor you can work with regarding anxiety about health We'll get you in to see the gynecologist about the IUD Let's increase the dosage of fluoxetine to 40 mg daily Call before next appt if needed Return in 3 weeks

## 2015-02-28 NOTE — Assessment & Plan Note (Signed)
Discussed counseling for anxiety; she does note improvement; increase fluoxetine from 20 mg to 40 mg daily

## 2015-02-28 NOTE — Progress Notes (Signed)
BP 106/70 mmHg  Pulse 77  Temp(Src) 97.1 F (36.2 C)  Wt 172 lb (78.019 kg)  SpO2 99%  LMP 02/12/2015 (Approximate)   Subjective:    Patient ID: Stephanie Kane, female    DOB: 08-10-1980, 34 y.o.   MRN: [id number]  HPI: Stephanie Kane is a 34 y.o. female  Chief Complaint  Patient presents with  . Depression    follow up on Prozac  . Anxiety   She had some anxiety Came in on Nov 8th and saw colleague She is off of wellbutrin now and is on prozac She thinks that the panic attacks may be hormonal, 2 weeks before or after her periods She is on 20 mg of prozac; she noticed some relief; but was feeling anxious last night; some headaches, and gets freaked out, gets anxious and it's all about her health; does not want to leave her children; a switch went off one day; does seem to be on a cycle; realizes the anxiety will build and then come down; prozac does cause some fatigue; the wellbutrin really did help with the energy She was taking adderall at one point for energy and then her heart was racing so that was stopped The only complaint about losing wellbutrin is that she doesn't have the energy; drinking more coffee now in the afternoon Occasional headaches, just thinking she is in tune to them now that she's worried about everything Things have improved since her last visit November 8th; she is just waiting though for another panic attack She is using the buspar just occasionally; she does not have depression  She is interested in getting her IUD replaced; current one was placed March almost five years ago; has Mirena in place, does not desire any more children, would [facility]  Relevant past medical, surgical, family and social history reviewed and updated as indicated. Interim medical history since our last visit reviewed. Allergies and medications reviewed and updated.  Review of Systems Per HPI unless specifically indicated above     Objective:     BP 106/70 mmHg  Pulse 77  Temp(Src) 97.1 F (36.2 C)  Wt 172 lb (78.019 kg)  SpO2 99%  LMP 02/12/2015 (Approximate)  Wt Readings from Last 3 Encounters:  02/28/15 172 lb (78.019 kg)  01/07/15 165 lb (74.844 kg)  06/18/14 158 lb (71.668 kg)    Physical Exam  Constitutional: She appears well-developed and well-nourished. No distress.  Weight gain 14 pounds in a little over 8 months  Eyes: EOM are normal. No scleral icterus.  Neck: No thyromegaly present.  Cardiovascular: Normal rate and regular rhythm.   Pulmonary/Chest: Effort normal and breath sounds normal.  Abdominal: She exhibits no distension.  Skin: No pallor.  Psychiatric: She has a normal mood and affect. Her behavior is normal. Judgment and thought content normal.      Assessment & Plan:   Problem List Items Addressed This Visit      Other   Anxiety - Primary    Discussed counseling for anxiety; she does note improvement; increase fluoxetine from 20 mg to 40 mg daily      Relevant Medications   FLUoxetine (PROZAC) 40 MG capsule   IUD (intrauterine device) in place   Relevant Orders   Ambulatory referral to Gynecology   Elevated ferritin   Relevant Orders   Ferritin (Completed)   Fatigue   Relevant Orders   Cortisol (Completed)   TSH (Completed)   VITAMIN  D 25 Hydroxy (Vit-D Deficiency, Fractures) (Completed)   Vitamin B12 (Completed)   CBC with Differential/Platelet (Completed)   Comprehensive metabolic panel (Completed)   ANA w/Reflex if Positive (Completed)      Follow up plan: Return in about 3 weeks (around 03/21/2015) for medication recheck.  Orders Placed This Encounter  Procedures  . Cortisol  . TSH  . VITAMIN D 25 Hydroxy (Vit-D Deficiency, Fractures)  . Vitamin B12  . CBC with Differential/Platelet  . Comprehensive metabolic panel  . Ferritin  . ANA w/Reflex if Positive  . Ambulatory referral to Gynecology   Meds ordered this encounter  Medications  . FLUoxetine (PROZAC) 40 MG  capsule    Sig: Take 1 capsule (40 mg total) by mouth daily.    Dispense:  30 capsule    Refill:  3    Pharmacist -- we're increasing the dose

## 2015-03-01 ENCOUNTER — Encounter: Payer: Self-pay | Admitting: Family Medicine

## 2015-03-01 LAB — COMPREHENSIVE METABOLIC PANEL
ALT: 27 IU/L (ref 0–32)
AST: 18 IU/L (ref 0–40)
Albumin/Globulin Ratio: 2 (ref 1.1–2.5)
Albumin: 4.5 g/dL (ref 3.5–5.5)
Alkaline Phosphatase: 32 IU/L — ABNORMAL LOW (ref 39–117)
BILIRUBIN TOTAL: 0.5 mg/dL (ref 0.0–1.2)
BUN/Creatinine Ratio: 16 (ref 8–20)
BUN: 11 mg/dL (ref 6–20)
CO2: 25 mmol/L (ref 18–29)
CREATININE: 0.69 mg/dL (ref 0.57–1.00)
Calcium: 9.4 mg/dL (ref 8.7–10.2)
Chloride: 100 mmol/L (ref 96–106)
GFR calc Af Amer: 130 mL/min/{1.73_m2} (ref >59)
GFR calc non Af Amer: 113 mL/min/{1.73_m2} (ref >59)
Globulin, Total: 2.2 g/dL (ref 1.5–4.5)
Glucose: 92 mg/dL (ref 65–99)
Potassium: 4.5 mmol/L (ref 3.5–5.2)
SODIUM: 137 mmol/L (ref 134–144)
Total Protein: 6.7 g/dL (ref 6.0–8.5)

## 2015-03-01 LAB — CBC WITH DIFFERENTIAL/PLATELET
Basophils Absolute: 0 10*3/uL (ref 0.0–0.2)
Basos: 1 %
EOS (ABSOLUTE): 0.1 10*3/uL (ref 0.0–0.4)
Eos: 2 %
Hematocrit: 40.5 % (ref 34.0–46.6)
Hemoglobin: 13 g/dL (ref 11.1–15.9)
Immature Grans (Abs): 0 10*3/uL (ref 0.0–0.1)
Immature Granulocytes: 0 %
Lymphocytes Absolute: 1.1 10*3/uL (ref 0.7–3.1)
Lymphs: 26 %
MCH: 29.5 pg (ref 26.6–33.0)
MCHC: 32.1 g/dL (ref 31.5–35.7)
MCV: 92 fL (ref 79–97)
Monocytes Absolute: 0.4 10*3/uL (ref 0.1–0.9)
Monocytes: 9 %
Neutrophils Absolute: 2.7 10*3/uL (ref 1.4–7.0)
Neutrophils: 62 %
Platelets: 359 10*3/uL (ref 150–379)
RBC: 4.4 x10E6/uL (ref 3.77–5.28)
RDW: 13.3 % (ref 12.3–15.4)
WBC: 4.3 10*3/uL (ref 3.4–10.8)

## 2015-03-01 LAB — CORTISOL: Cortisol: 13.1 ug/dL

## 2015-03-01 LAB — ANA W/REFLEX IF POSITIVE: Anti Nuclear Antibody(ANA): NEGATIVE

## 2015-03-01 LAB — VITAMIN B12: Vitamin B-12: 568 pg/mL (ref 211–946)

## 2015-03-01 LAB — FERRITIN: FERRITIN: 153 ng/mL — AB (ref 15–150)

## 2015-03-01 LAB — VITAMIN D 25 HYDROXY (VIT D DEFICIENCY, FRACTURES): Vit D, 25-Hydroxy: 36.8 ng/mL (ref 30.0–100.0)

## 2015-03-01 LAB — TSH: TSH: 2.6 u[IU]/mL (ref 0.450–4.500)

## 2015-03-20 ENCOUNTER — Ambulatory Visit (INDEPENDENT_AMBULATORY_CARE_PROVIDER_SITE_OTHER): Payer: BLUE CROSS/BLUE SHIELD | Admitting: Family Medicine

## 2015-03-20 ENCOUNTER — Encounter: Payer: Self-pay | Admitting: Family Medicine

## 2015-03-20 VITALS — BP 114/74 | HR 75 | Temp 98.9°F | Ht 63.7 in | Wt 170.2 lb

## 2015-03-20 DIAGNOSIS — R002 Palpitations: Secondary | ICD-10-CM

## 2015-03-20 DIAGNOSIS — J069 Acute upper respiratory infection, unspecified: Secondary | ICD-10-CM

## 2015-03-20 DIAGNOSIS — F329 Major depressive disorder, single episode, unspecified: Secondary | ICD-10-CM | POA: Diagnosis not present

## 2015-03-20 DIAGNOSIS — R7989 Other specified abnormal findings of blood chemistry: Secondary | ICD-10-CM

## 2015-03-20 DIAGNOSIS — F419 Anxiety disorder, unspecified: Secondary | ICD-10-CM

## 2015-03-20 DIAGNOSIS — F32A Depression, unspecified: Secondary | ICD-10-CM

## 2015-03-20 MED ORDER — FLUOXETINE HCL 20 MG PO TABS: 20.0000 mg | ORAL_TABLET | Freq: Every day | ORAL | Status: DC

## 2015-03-20 NOTE — Assessment & Plan Note (Signed)
Doing well; will use SSRI, but decrease dose back down to 20 mg daily; thyroid testing, vit D, and B12 results all reviewed with her today; she may continue the D at 1000 iu daily, B12 as well

## 2015-03-20 NOTE — Progress Notes (Signed)
BP 114/74 mmHg  Pulse 75  Temp(Src) 98.9 F (37.2 C)  Ht 5' 3.7" (1.618 m)  Wt 170 lb 3.2 oz (77.202 kg)  BMI 29.49 kg/m2  SpO2 97%  LMP 03/11/2015 (Approximate)   Subjective:    Patient ID: Stephanie Kane, female    DOB: 1980/12/10, 35 y.o.   MRN: [id number]  HPI: Stephanie Kane is a 35 y.o. female  Chief Complaint  Patient presents with  . Depression    pt states she is here for medication f/u    Depression screen [facility] 2/9 03/20/2015 02/28/2015  Decreased Interest 0 0  Down, Depressed, Hopeless 1 0  PHQ - 2 Score 1 0   At her last visit, we increased the SSRI from 20 mg daily to 40 mg daily She was hesitant to switch to the higher dose because she hadn't been on the 20 mg for very low Does feel anxious at times She switched 1-2 days after the last appointment to the 40 mg strength She feels like it is cyclical, related to her period; she wants to see if increasing the dosage could cause her feel more anxious Uses buspar just once in a while; doesn't like to take medicine; she keeps lorazepam for just emergencies; has not needed since starting fluoxetine; she has had some episodes when she gets so anxious, so amped up, just terrified; more like a security blanket  She has been sick for the last week; gunk in her head and chest; yellowish and green at times; clear sometimes; battling this for almost two weeks; back and forth; not sure if that has increased her anxiety She has not been taking any decongestants; taking benadryl only; she doesn't like how sudafed makes her feel Has a little back stiffness, trying to stretch  We reviewed her labs; taking OTC vit D and vit B12  Ferritin just 3 points above normal but was taking iron supplement; now stopped  Relevant past medical, surgical, family and social history reviewed and updated as indicated. Interim medical history since our last visit reviewed. Allergies and medications reviewed and updated.  Review of Systems Per  HPI unless specifically indicated above     Objective:    BP 114/74 mmHg  Pulse 75  Temp(Src) 98.9 F (37.2 C)  Ht 5' 3.7" (1.618 m)  Wt 170 lb 3.2 oz (77.202 kg)  BMI 29.49 kg/m2  SpO2 97%  LMP 03/11/2015 (Approximate)  Wt Readings from Last 3 Encounters:  03/20/15 170 lb 3.2 oz (77.202 kg)  02/28/15 172 lb (78.019 kg)  01/07/15 165 lb (74.844 kg)    Physical Exam  Constitutional: She appears well-developed and well-nourished. No distress.  HENT:  Head: Normocephalic and atraumatic.  Right Ear: Hearing, tympanic membrane, external ear and ear canal normal.  Left Ear: Hearing, tympanic membrane, external ear and ear canal normal.  Nose: Nose normal.  Mouth/Throat: Posterior oropharyngeal erythema (very minimal injection) present. No oropharyngeal exudate or posterior oropharyngeal edema.  Eyes: EOM are normal. Right eye exhibits no discharge. Left eye exhibits no discharge. No scleral icterus.  Neck: No thyromegaly present.  Cardiovascular: Normal rate and regular rhythm.   Pulmonary/Chest: Effort normal and breath sounds normal. She has no wheezes. She has no rales.  Musculoskeletal: She exhibits no edema.  Lymphadenopathy:    She has no cervical adenopathy.  Neurological: She is alert.  Skin: Skin is warm and dry. She is not diaphoretic. No erythema. No pallor.  Psychiatric: She has a normal mood and  affect. Her behavior is normal. Judgment and thought content normal.    Results for orders placed or performed in visit on 02/28/15  Cortisol  Result Value Ref Range   Cortisol 13.1 ug/dL  TSH  Result Value Ref Range   TSH 2.600 0.450 - 4.500 uIU/mL  VITAMIN D 25 Hydroxy (Vit-D Deficiency, Fractures)  Result Value Ref Range   Vit D, 25-Hydroxy 36.8 30.0 - 100.0 ng/mL  Vitamin B12  Result Value Ref Range   Vitamin B-12 568 211 - 946 pg/mL  CBC with Differential/Platelet  Result Value Ref Range   WBC 4.3 3.4 - 10.8 x10E3/uL   RBC 4.40 3.77 - 5.28 x10E6/uL    Hemoglobin 13.0 11.1 - 15.9 g/dL   Hematocrit 16.1 09.6 - 46.6 %   MCV 92 79 - 97 fL   MCH 29.5 26.6 - 33.0 pg   MCHC 32.1 31.5 - 35.7 g/dL   RDW 04.5 40.9 - 81.1 %   Platelets 359 150 - 379 x10E3/uL   Neutrophils 62 %   Lymphs 26 %   Monocytes 9 %   Eos 2 %   Basos 1 %   Neutrophils Absolute 2.7 1.4 - 7.0 x10E3/uL   Lymphocytes Absolute 1.1 0.7 - 3.1 x10E3/uL   Monocytes Absolute 0.4 0.1 - 0.9 x10E3/uL   EOS (ABSOLUTE) 0.1 0.0 - 0.4 x10E3/uL   Basophils Absolute 0.0 0.0 - 0.2 x10E3/uL   Immature Granulocytes 0 %   Immature Grans (Abs) 0.0 0.0 - 0.1 x10E3/uL  Comprehensive metabolic panel  Result Value Ref Range   Glucose 92 65 - 99 mg/dL   BUN 11 6 - 20 mg/dL   Creatinine, Ser 9.14 0.57 - 1.00 mg/dL   GFR calc non Af Amer 113 >59 mL/min/1.73   GFR calc Af Amer 130 >59 mL/min/1.73   BUN/Creatinine Ratio 16 8 - 20   Sodium 137 134 - 144 mmol/L   Potassium 4.5 3.5 - 5.2 mmol/L   Chloride 100 96 - 106 mmol/L   CO2 25 18 - 29 mmol/L   Calcium 9.4 8.7 - 10.2 mg/dL   Total Protein 6.7 6.0 - 8.5 g/dL   Albumin 4.5 3.5 - 5.5 g/dL   Globulin, Total 2.2 1.5 - 4.5 g/dL   Albumin/Globulin Ratio 2.0 1.1 - 2.5   Bilirubin Total 0.5 0.0 - 1.2 mg/dL   Alkaline Phosphatase 32 (L) 39 - 117 IU/L   AST 18 0 - 40 IU/L   ALT 27 0 - 32 IU/L  Ferritin  Result Value Ref Range   Ferritin 153 (H) 15 - 150 ng/mL  ANA w/Reflex if Positive  Result Value Ref Range   Anit Nuclear Antibody(ANA) Negative Negative      Assessment & Plan:   Problem List Items Addressed This Visit      Other   Palpitations    None heard on exam; normal K+, TSH; may be related to anxiety      Depression    Doing well; will use SSRI, but decrease dose back down to 20 mg daily; thyroid testing, vit D, and B12 results all reviewed with her today; she may continue the D at 1000 iu daily, B12 as well      Relevant Medications   FLUoxetine (PROZAC) 20 MG tablet   Anxiety    Will decrease the SSRI from 40 mg to 20  mg; she can tell a difference with menstrual cycle; she might benefit from supplemental topical hormone therapy, but I explained that I  don't do that; she could seek out naturopath or GYN who practices that, but she may benefit from very low dose progesterone or estrogen or testosterone based on her individual panel of labs if indicated; she may consider that; she is not taking buspirone often, but I have explained to her before and she brought it up today that she is aware that it works better if taken regularly; she will keep a few of the benzo around for stormy occasions, rare use      Relevant Medications   FLUoxetine (PROZAC) 20 MG tablet   Elevated ferritin    Discussed recent results; she will stay off of supplementation; recheck in 3 months; if climbing off of supplementation, consider testing for [facility] (though low, but she is menstruating female so may be masked); could also be acute phase reactant       Other Visit Diagnoses    Viral upper respiratory infection    -  Primary    explained no antibiotic needed for this; contagious; treat with rest, hydration, vit C, green tea, etc       Follow up plan: Return in about 3 months (around 06/18/2015) for non-fasting labs and visit.  Try gentle stretching for back; may be sitting, not active enough; she agrees An after-visit summary was printed and given to the patient at check-out.  Please see the patient instructions which may contain other information and recommendations beyond what is mentioned above in the assessment and plan. Meds ordered this encounter  Medications  . FLUoxetine (PROZAC) 20 MG tablet    Sig: Take 1 tablet (20 mg total) by mouth daily.    Dispense:  30 tablet    Refill:  3    Pharmacist -- we are going back to 20 mg   Face-to-face time with patient was more than 25 minutes, >50% time spent counseling and coordination of care

## 2015-03-20 NOTE — Assessment & Plan Note (Signed)
Discussed recent results; she will stay off of supplementation; recheck in 3 months; if climbing off of supplementation, consider testing for [facility] (though low, but she is menstruating female so may be masked); could also be acute phase reactant

## 2015-03-20 NOTE — Assessment & Plan Note (Signed)
None heard on exam; normal K+, TSH; may be related to anxiety

## 2015-03-20 NOTE — Patient Instructions (Addendum)
Decrease the fluoxetine to 20 mg daily Avoid extra iron supplement Okay to stay on B12 and vitamin D 1,000 iu daily Recheck labs in 3 months to make sure ferritin is back down Try vitamin C (orange juice if not diabetic or vitamin C tablets) and drink green tea to help your immune system during your illness Get plenty of rest and hydration

## 2015-03-20 NOTE — Assessment & Plan Note (Addendum)
Will decrease the SSRI from 40 mg to 20 mg; she can tell a difference with menstrual cycle; she might benefit from supplemental topical hormone therapy, but I explained that I don't do that; she could seek out naturopath or GYN who practices that, but she may benefit from very low dose progesterone or estrogen or testosterone based on her individual panel of labs if indicated; she may consider that; she is not taking buspirone often, but I have explained to her before and she brought it up today that she is aware that it works better if taken regularly; she will keep a few of the benzo around for stormy occasions, rare use

## 2015-05-14 ENCOUNTER — Telehealth: Payer: Self-pay | Admitting: *Deleted

## 2015-05-14 NOTE — Telephone Encounter (Signed)
Dr. Marice Potterove denied cytotec for IUD insert.

## 2015-05-14 NOTE — Telephone Encounter (Signed)
-----   Message from Olevia BowensJacinda S Battle sent at 05/13/2015  1:24 PM EDT ----- Regarding: Upcoming Appt IUD Removal and Re-Insert, Dove day 03/21

## 2015-05-20 ENCOUNTER — Encounter: Payer: Self-pay | Admitting: Obstetrics & Gynecology

## 2015-05-20 ENCOUNTER — Ambulatory Visit (INDEPENDENT_AMBULATORY_CARE_PROVIDER_SITE_OTHER): Payer: BLUE CROSS/BLUE SHIELD | Admitting: Obstetrics & Gynecology

## 2015-05-20 ENCOUNTER — Encounter: Payer: Self-pay | Admitting: *Deleted

## 2015-05-20 VITALS — BP 126/75 | HR 98 | Resp 18 | Ht 63.0 in | Wt 180.0 lb

## 2015-05-20 DIAGNOSIS — Z30433 Encounter for removal and reinsertion of intrauterine contraceptive device: Secondary | ICD-10-CM | POA: Diagnosis not present

## 2015-05-20 MED ORDER — LEVONORGESTREL 20 MCG/24HR IU IUD
1.0000 | INTRAUTERINE_SYSTEM | Freq: Once | INTRAUTERINE | Status: AC
  Administered 2015-05-20: 1 via INTRAUTERINE

## 2015-05-20 NOTE — Progress Notes (Signed)
   Subjective:    Patient ID: Stephanie Kane, female    DOB: 08-21-1980, [age] y.o.   MRN: [id number]  HPI  35 yo P3 here for removal of her 35 yo Mirena and replacement with a new one. She does not have periods.  Review of Systems She reports an UTD pap smear at her primary care's office.    Objective:   Physical Exam [id number] Her Mirena was easily removed and noted to be intact. Consent signed, Time out procedure done. Cervix prepped with betadine and grasped with a single tooth tenaculum. Mirena was easily placed and the strings were cut to 3-4 cm. Uterus sounded to 9 cm. She tolerated the procedure well.      Assessment & Plan:  Contraception- Mirena

## 2015-05-20 NOTE — Addendum Note (Signed)
Addended by: Gita KudoLASSITER, Lilleigh Hechavarria S on: 05/20/2015 09:49 AM   Modules accepted: Orders

## 2015-05-27 ENCOUNTER — Telehealth: Payer: Self-pay

## 2015-05-27 NOTE — Telephone Encounter (Signed)
I am not sure about the note below; I would be glad to change her medicine if she is having symptoms I called, left detailed message; explained that if she did not tolerate the one medicine, I would be glad to try something else; just call us back and I'll be glad to talk with her If instead she would rather try going without medicine and just wait until her appt, that's up to her I did want to make sure she knows that I WILL indeed make medicine changes over the phone, and apologized for any confusion relayed to her earlier

## 2015-05-27 NOTE — Telephone Encounter (Signed)
She wanted to let you know she stopped the Prozac a week ago. She was gaining a lot of weight on it and just didn't like that. She wants to try going without anything [facility]. I advised her that if she decided she couldn't wait until then and needed to get back on something to call [facility] and move up her appointment. I advised her you wouldn't do medication changes like that over the phone. She understood.

## 2015-06-19 ENCOUNTER — Ambulatory Visit (INDEPENDENT_AMBULATORY_CARE_PROVIDER_SITE_OTHER): Payer: BLUE CROSS/BLUE SHIELD | Admitting: Family Medicine

## 2015-06-19 ENCOUNTER — Encounter: Payer: Self-pay | Admitting: Family Medicine

## 2015-06-19 VITALS — BP 122/70 | HR 88 | Temp 99.5°F | Resp 16 | Ht 63.0 in | Wt 188.6 lb

## 2015-06-19 DIAGNOSIS — F419 Anxiety disorder, unspecified: Secondary | ICD-10-CM | POA: Diagnosis not present

## 2015-06-19 DIAGNOSIS — R8290 Unspecified abnormal findings in urine: Secondary | ICD-10-CM | POA: Diagnosis not present

## 2015-06-19 DIAGNOSIS — R7989 Other specified abnormal findings of blood chemistry: Secondary | ICD-10-CM | POA: Diagnosis not present

## 2015-06-19 DIAGNOSIS — N390 Urinary tract infection, site not specified: Secondary | ICD-10-CM

## 2015-06-19 DIAGNOSIS — R635 Abnormal weight gain: Secondary | ICD-10-CM

## 2015-06-19 DIAGNOSIS — R8281 Pyuria: Secondary | ICD-10-CM

## 2015-06-19 LAB — POCT URINALYSIS DIPSTICK
Bilirubin, UA: NEGATIVE
Glucose, UA: NEGATIVE
Ketones, UA: NEGATIVE
Nitrite, UA: NEGATIVE
PROTEIN UA: NEGATIVE
RBC UA: NEGATIVE
SPEC GRAV UA: 1.01
UROBILINOGEN UA: 0.2
pH, UA: 6.5

## 2015-06-19 MED ORDER — NITROFURANTOIN MONOHYD MACRO 100 MG PO CAPS
100.0000 mg | ORAL_CAPSULE | Freq: Two times a day (BID) | ORAL | Status: AC
Start: 2015-06-19 — End: 2015-06-22

## 2015-06-19 NOTE — Assessment & Plan Note (Signed)
Urine culture pending; start antibiotics

## 2015-06-19 NOTE — Assessment & Plan Note (Signed)
She will continue the buspirone PRN; she does not want to take anything daily right now; I suggested limiting her caffeine

## 2015-06-19 NOTE — Assessment & Plan Note (Signed)
Check today 

## 2015-06-19 NOTE — Progress Notes (Signed)
BP 122/70 mmHg  Pulse 88  Temp(Src) 99.5 F (37.5 C) (Oral)  Resp 16  Ht  (1.6 m)  Wt 188 lb 9.6 oz (85.548 kg)  BMI 33.42 kg/m2  SpO2 99%  LMP 05/19/2015 (Approximate)   Subjective:    Patient ID: Stephanie Kane, female    DOB: 02/19/1981, 35 y.o.   MRN: [id number]  HPI: Stephanie Kane is a 35 y.o. female  Chief Complaint  Patient presents with  . Follow-up    fasting labs  (she is not here for fasting labs)  She is concerned that she might have a bladder infection; she thinks she had one before and got over it on her own; had a strong different smell a few weeks ago; smell is better now; urgency and crampy feel; those symptoms have almost gone away; no burning at first, would just cut off suddenly, uncomfortable sensation; no blood in the urine; no lower abd pain or back pain; had IUD replaced on March 21st; funky bleeding/spotting and cramping for a little bit, light spotting for a week, then kind of crampy to have it rechecked; strings are in place; no fever  She quit taking the prozac; she had been having anxiety; was taking bupropion, 3 of those; they took the edge off; she started to have anxiety for over a year now; certain peaks time, seems to follow a pattern; before the IUD switched out, it was the first of the month; since Dec, she has gained 40 pounds; the prozac did take some of the edge off, but still had the anxious evenings, and would take the buspirone; she does take it and it works; Teacher, early years/pre and other doctor told her it was low dose; just stopped the prozac because she thought it was contributing; sister gained weight on prozac; since stopping the prozac, energy improved a little; feels crotchety and cranky; getting enough sleep; provider stopped the wellbutrin because it might have caused anxiety; she looked back and thinks that might have been an issue; still gaining weight even though   TSH  Status: Finalresult Visible to patient:  Not Released  Nextappt: None Dx:  Other fatigue         Ref Range 54mo ago    TSH 0.450 - 4.500 uIU/mL 2.600        VITAMIN D 25 Hydroxy (Vit-D Deficiency, Fractures)  Status: Finalresult Visible to patient:  Not Released Nextappt: None Dx:  Other fatigue         Ref Range 54mo ago    Vit D, 25-Hydroxy 30.0 - 100.0 ng/mL 36.8        Cortisol  Status: Finalresult Visible to patient:  Not Released Nextappt: None Dx:  Other fatigue         Ref Range 54mo ago    Cortisol ug/dL 36.6   Comments:            Cortisol AM     6.2 - 19.4              Cortisol PM     2.3 - 11.9         Ferritin  Status: Finalresult Visible to patient:  Not Released Nextappt: None Dx:  Elevated ferritin            Ref Range 54mo ago    Ferritin 15 - 150 ng/mL 153 (H)        ANA w/Reflex if Positive  Status: Finalresult Visible to patient:  Not Released  Nextappt: None Dx:  Other fatigue         Ref Range 19mo ago    Anit Nuclear Antibody(ANA) Negative  Negative         Depression screen [facility] 2/9 06/19/2015 03/20/2015 02/28/2015  Decreased Interest 0 0 0  Down, Depressed, Hopeless 0 1 0  PHQ - 2 Score 0 1 0    GAD 7 : Generalized Anxiety Score 02/28/2015  Nervous, Anxious, on Edge 1  Control/stop worrying 1  Worry too much - different things 0  Trouble relaxing 0  Restless 0  Easily annoyed or irritable 0  Afraid - awful might happen 0  Total GAD 7 Score 2  Anxiety Difficulty Not difficult at all   Relevant past medical, surgical, family and social history reviewed Interim medical history since our last visit reviewed. Allergies and medications reviewed  Review of Systems Per HPI unless specifically indicated above     Objective:    BP 122/70 mmHg  Pulse 88  Temp(Src) 99.5 F (37.5 C) (Oral)  Resp 16  Ht  (1.6 m)  Wt 188 lb 9.6 oz (85.548 kg)  BMI  33.42 kg/m2  SpO2 99%  LMP 05/19/2015 (Approximate)  Wt Readings from Last 3 Encounters:  06/19/15 188 lb 9.6 oz (85.548 kg)  05/20/15 180 lb (81.647 kg)  03/20/15 170 lb 3.2 oz (77.202 kg)    Physical Exam  Constitutional: She appears well-developed and well-nourished.  Weight gain of 18 pounds in 3 months  HENT:  Mouth/Throat: Mucous membranes are normal.  Eyes: EOM are normal. No scleral icterus.  Neck: No thyromegaly present.  Cardiovascular: Normal rate and regular rhythm.   Pulmonary/Chest: Effort normal and breath sounds normal. No respiratory distress. She has no decreased breath sounds.  Neurological: She displays no tremor.  Reflex Scores:      Patellar reflexes are 1+ on the right side and 1+ on the left side. Psychiatric: She has a normal mood and affect. Her behavior is normal. Her mood appears not anxious. She does not exhibit a depressed mood.  Good eye contact with examiner   Urinalysis    Component Value Date/Time   BILIRUBINUR neg 06/19/2015 1013   PROTEINUR neg 06/19/2015 1013   UROBILINOGEN 0.2 06/19/2015 1013   NITRITE neg 06/19/2015 1013   LEUKOCYTESUR large (3+)* 06/19/2015 1013      Assessment & Plan:   Problem List Items Addressed This Visit      Other   Anxiety    She will continue the buspirone PRN; she does not want to take anything daily right now; I suggested limiting her caffeine      Elevated ferritin    Check today      Relevant Orders   Ferritin (Completed)   Pyuria    Urine culture pending; start antibiotics      Relevant Orders   Urine Culture (Completed)    Other Visit Diagnoses    Bad odor of urine    -  Primary    check urine dip today; suggest cranberry juice or pills; hydration    Relevant Orders    POCT urinalysis dipstick (Completed)    Abnormal weight gain        check TSH, free T4, free T3    Relevant Orders    TSH (Completed)    T4, free (Completed)    T3, free (Completed)       Follow up plan: Return  if symptoms worsen or fail to improve.  An after-visit summary was printed and given to the patient at check-out.  Please see the patient instructions which may contain other information and recommendations beyond what is mentioned above in the assessment and plan.  Meds ordered this encounter  Medications  . nitrofurantoin, macrocrystal-monohydrate, (MACROBID) 100 MG capsule    Sig: Take 1 capsule (100 mg total) by mouth 2 (two) times daily.    Dispense:  6 capsule    Refill:  0    Orders Placed This Encounter  Procedures  . Urine Culture  . TSH  . T4, free  . T3, free  . Ferritin  . POCT urinalysis dipstick

## 2015-06-19 NOTE — Patient Instructions (Addendum)
Let's get some labs today We'll talk tomorrow or Monday when those are back about the results I do recommend you limit your total daily caffeine, as that can increase feelings of anxiousness If weight gain or feelings of fatigue persist despite lab results, then please do come back in for further work-up Try cranberry pills daily to help prevent bladder infections, and stay well-hydrated Start the new antibiotics and let me of any ongoing symptoms after this is treated

## 2015-06-20 LAB — T3, FREE: T3, Free: 3.1 pg/mL (ref 2.0–4.4)

## 2015-06-20 LAB — FERRITIN: Ferritin: 107 ng/mL (ref 15–150)

## 2015-06-20 LAB — TSH: TSH: 3.3 u[IU]/mL (ref 0.450–4.500)

## 2015-06-20 LAB — T4, FREE: Free T4: 1.05 ng/dL (ref 0.82–1.77)

## 2015-06-21 LAB — URINE CULTURE

## 2015-07-03 ENCOUNTER — Telehealth: Payer: Self-pay

## 2015-07-03 NOTE — Telephone Encounter (Signed)
So patient called this am wanted to see about getting in with you stated she had elevated heart rate at 117 and she could feel her heart race and felt funny.  I told her we were all booked up today with 19 people on the schedule I told her I would notify you and would recommend her to go to urgent care or ER to get checked out.  She agreed.

## 2015-07-23 ENCOUNTER — Telehealth: Payer: Self-pay | Admitting: Family Medicine

## 2015-07-23 MED ORDER — VENLAFAXINE HCL ER 37.5 MG PO CP24: ORAL_CAPSULE | ORAL | Status: DC

## 2015-07-23 NOTE — Telephone Encounter (Signed)
Pt called wants to see about going ahead and getting on new med besides prozac for anxiety? Something that will no t make her gain weight?

## 2015-07-23 NOTE — Telephone Encounter (Signed)
Pt would like a call about her anxiety. She scheduled an appt for first available which is 08/04/2015.

## 2015-07-23 NOTE — Telephone Encounter (Signed)
Please let her know that we'll start venlafaxine (Effexor); start at very low dose 37.5 mg daily x 2 weeks, then two a day (75 mg daily) Call with any issues

## 2015-08-04 ENCOUNTER — Ambulatory Visit (INDEPENDENT_AMBULATORY_CARE_PROVIDER_SITE_OTHER): Payer: BLUE CROSS/BLUE SHIELD | Admitting: Family Medicine

## 2015-08-04 ENCOUNTER — Encounter: Payer: Self-pay | Admitting: Family Medicine

## 2015-08-04 VITALS — BP 124/72 | HR 97 | Temp 98.3°F | Resp 16 | Wt 185.0 lb

## 2015-08-04 DIAGNOSIS — J309 Allergic rhinitis, unspecified: Secondary | ICD-10-CM

## 2015-08-04 DIAGNOSIS — R3915 Urgency of urination: Secondary | ICD-10-CM

## 2015-08-04 DIAGNOSIS — F329 Major depressive disorder, single episode, unspecified: Secondary | ICD-10-CM

## 2015-08-04 DIAGNOSIS — F32A Depression, unspecified: Secondary | ICD-10-CM

## 2015-08-04 LAB — POCT URINALYSIS DIPSTICK
Bilirubin, UA: NEGATIVE
Glucose, UA: NEGATIVE
KETONES UA: NEGATIVE
Leukocytes, UA: NEGATIVE
Nitrite, UA: NEGATIVE
PROTEIN UA: NEGATIVE
RBC UA: NEGATIVE
SPEC GRAV UA: 1.015
UROBILINOGEN UA: 0.2
pH, UA: 5.5

## 2015-08-04 MED ORDER — VORTIOXETINE HBR 5 MG PO TABS: 1.0000 | ORAL_TABLET | Freq: Every day | ORAL | Status: DC

## 2015-08-04 NOTE — Patient Instructions (Signed)
Call [facility], Dorothyann Pengobyn Sanders, M.D. with [facility], at [phone] Ext. [phone]  Check out bio-identical hormone therapy through local GYN or holistic health center Start Trintellix Continue buspar, and take twice a day Call me with an update in 2 weeks and return for visit in 4 weeks

## 2015-08-04 NOTE — Progress Notes (Signed)
BP 124/72 mmHg  Pulse 97  Temp(Src) 98.3 F (36.8 C) (Oral)  Resp 16  Wt 185 lb (83.915 kg)  SpO2 98%   Subjective:    Patient ID: Stephanie Kane, female    DOB: 04-May-1980, 35 y.o.   MRN: [id number]  HPI: Stephanie Kane is a 35 y.o. female  Chief Complaint  Patient presents with  . Anxiety    stopped effexor due to making her jittery  . Urinary Tract Infection    urgency and pressure   She has been taking cetirizine and then switched to allegra (plain, not the D) Was having headaches; no nausea; no vision changes Thought it was maybe allergies; just feeling yucky; felt like she was sick, low energy; feeling depressed, nothing in particular; no energy; had a nasty cold going through the house, snot and yuckiness; lasted for a few weeks, then better Gained weight  Felt better when on prozac, but gained weight, 30 pounds; stopped March 21st Anxious about her health The effexor really made her jittery; had more energy but felt really anxious; only took it for a few days, just revved up and then stopped after 2-3 days She is interested in starting counselor; I showed her a list of counselors then and again today She is interested in trying something else; she is on the buspar; has tried bupropion, prozac, effexor She asked about hormonal  [facility] 2/9 08/04/2015 06/19/2015 03/20/2015 02/28/2015  Decreased Interest 0 0 0 0  Down, Depressed, Hopeless 1 0 1 0  PHQ - 2 Score 1 0 1 0    GAD 7 : Generalized Anxiety Score 02/28/2015  Nervous, Anxious, on Edge 1  Control/stop worrying 1  Worry too much - different things 0  Trouble relaxing 0  Restless 0  Easily annoyed or irritable 0  Afraid - awful might happen 0  Total GAD 7 Score 2  Anxiety Difficulty Not difficult at all   Relevant past medical, surgical, family and social history reviewed Past Medical History  Diagnosis Date  . Allergy   . Anxiety   . ADD (attention deficit disorder)   . Depression     . Pap smear abnormality of cervix with ASCUS favoring benign     Neg HPV 04/2009  . Allergic rhinitis 08/24/2015   No past surgical history on file. Family History  Problem Relation Age of Onset  . Hypertension Maternal Grandmother   . Heart disease Sister    Social History  Substance Use Topics  . Smoking status: Former Smoker    Types: Cigarettes    Quit date: 01/07/2000  . Smokeless tobacco: Never Used  . Alcohol Use: Yes     Comment: occasionally   Interim medical history since last visit reviewed. Allergies and medications reviewed  Review of Systems Per HPI unless specifically indicated above     Objective:    BP 124/72 mmHg  Pulse 97  Temp(Src) 98.3 F (36.8 C) (Oral)  Resp 16  Wt 185 lb (83.915 kg)  SpO2 98%  Wt Readings from Last 3 Encounters:  08/04/15 185 lb (83.915 kg)  06/19/15 188 lb 9.6 oz (85.548 kg)  05/20/15 180 lb (81.647 kg)    Physical Exam  Constitutional: She appears well-developed and well-nourished. No distress.  HENT:  Head: Normocephalic and atraumatic.  Eyes: EOM are normal. No scleral icterus.  Neck: No thyromegaly present.  Cardiovascular: Normal rate, regular rhythm and normal heart sounds.   No murmur heard. Pulmonary/Chest: Effort  normal and breath sounds normal. No respiratory distress. She has no wheezes.  Abdominal: Soft. Bowel sounds are normal. She exhibits no distension.  Musculoskeletal: Normal range of motion. She exhibits no edema.  Neurological: She is alert. She exhibits normal muscle tone.  Skin: Skin is warm and dry. She is not diaphoretic. No pallor.  Psychiatric: She has a normal mood and affect. Her behavior is normal. Judgment and thought content normal.   Results for orders placed or performed in visit on 08/04/15  POCT urinalysis dipstick  Result Value Ref Range   Color, UA light yellow    Clarity, UA clear    Glucose, UA neg    Bilirubin, UA neg    Ketones, UA neg    Spec Grav, UA 1.015    Blood, UA  neg    pH, UA 5.5    Protein, UA neg    Urobilinogen, UA 0.2    Nitrite, UA neg    Leukocytes, UA Negative Negative      Assessment & Plan:   Problem List Items Addressed This Visit      Respiratory   Allergic rhinitis    May use OTC products, but avoid ones containing decongestants as those can exacerbate anxiety, palpitations        Other   Depression    Will start trintellix; reasons to call reviewed; we reviewed previous meds and previous labs; sh emight benefit from bioidentical hormones; she may seek out a provider competent in that therapy      Relevant Medications   vortioxetine HBr (TRINTELLIX) 5 MG TABS    Other Visit Diagnoses    Urinary urgency    -  Primary    Relevant Orders    POCT urinalysis dipstick (Completed)       Follow up plan: Return in about 4 weeks (around 09/01/2015) for medication follow-up.  An after-visit summary was printed and given to the patient at check-out.  Please see the patient instructions which may contain other information and recommendations beyond what is mentioned above in the assessment and plan.  Meds ordered this encounter  Medications  . vortioxetine HBr (TRINTELLIX) 5 MG TABS    Sig: Take 1 tablet (5 mg total) by mouth daily.    Dispense:  30 tablet    Refill:  0    Orders Placed This Encounter  Procedures  . POCT urinalysis dipstick

## 2015-08-24 ENCOUNTER — Encounter: Payer: Self-pay | Admitting: Family Medicine

## 2015-08-24 DIAGNOSIS — J309 Allergic rhinitis, unspecified: Secondary | ICD-10-CM

## 2015-08-24 HISTORY — DX: Allergic rhinitis, unspecified: J30.9

## 2015-08-24 NOTE — Assessment & Plan Note (Addendum)
Will start trintellix; reasons to call reviewed; we reviewed previous meds and previous labs; sh emight benefit from bioidentical hormones; she may seek out a provider competent in that therapy

## 2015-08-24 NOTE — Assessment & Plan Note (Signed)
May use OTC products, but avoid ones containing decongestants as those can exacerbate anxiety, palpitations

## 2015-08-27 ENCOUNTER — Encounter: Payer: BLUE CROSS/BLUE SHIELD | Admitting: Family Medicine

## 2015-09-03 ENCOUNTER — Ambulatory Visit: Payer: BLUE CROSS/BLUE SHIELD | Admitting: Obstetrics and Gynecology

## 2015-09-08 ENCOUNTER — Ambulatory Visit: Payer: BLUE CROSS/BLUE SHIELD | Admitting: Family Medicine

## 2018-01-02 ENCOUNTER — Telehealth: Payer: Self-pay | Admitting: Family Medicine

## 2018-01-02 ENCOUNTER — Encounter: Payer: Self-pay | Admitting: Family Medicine

## 2018-01-02 ENCOUNTER — Ambulatory Visit: Payer: Self-pay | Admitting: Family Medicine

## 2018-01-02 VITALS — BP 118/78 | HR 99 | Temp 98.4°F | Ht 63.0 in | Wt 217.7 lb

## 2018-01-02 DIAGNOSIS — H6991 Unspecified Eustachian tube disorder, right ear: Secondary | ICD-10-CM

## 2018-01-02 DIAGNOSIS — J302 Other seasonal allergic rhinitis: Secondary | ICD-10-CM

## 2018-01-02 DIAGNOSIS — R635 Abnormal weight gain: Secondary | ICD-10-CM

## 2018-01-02 DIAGNOSIS — E669 Obesity, unspecified: Secondary | ICD-10-CM | POA: Insufficient documentation

## 2018-01-02 DIAGNOSIS — F419 Anxiety disorder, unspecified: Secondary | ICD-10-CM

## 2018-01-02 MED ORDER — MONTELUKAST SODIUM 10 MG PO TABS: 10.0000 mg | ORAL_TABLET | Freq: Every day | ORAL | 11 refills | Status: AC

## 2018-01-02 MED ORDER — MONTELUKAST SODIUM 10 MG PO TABS: 10.0000 mg | ORAL_TABLET | Freq: Every day | ORAL | 11 refills | Status: DC

## 2018-01-02 NOTE — Telephone Encounter (Signed)
Copied from CRM [id number]. Topic: Quick Communication - See Telephone Encounter >> Jan 02, 2018 12:39 PM Jens Som A wrote: CRM for notification. See Telephone encounter for: 01/02/18.  Patient is calling because her montelukast (SINGULAIR) 10 MG tablet [[id number]] was sen to the wrong [facility]. At this time she would like it sent to [id number] [place], [place] - [address] AT [facility] [address] [place] [address] Phone: [phone] Fax: [phone]

## 2018-01-02 NOTE — Patient Instructions (Addendum)
Start the singulair which may help with allergies; if not helpful after 2 weeks, just stop  Check out the information at familydoctor.org entitled "Nutrition for Weight Loss: What You Need to Know about Fad Diets" Try to lose between 1-2 pounds per week by taking in fewer calories and burning off more calories You can succeed by limiting portions, limiting foods dense in calories and fat, becoming more active, and drinking 8 glasses of water a day (64 ounces) Don't skip meals, especially breakfast, as skipping meals may alter your metabolism Do not use over-the-counter weight loss pills or gimmicks that claim rapid weight loss A healthy BMI (or body mass index) is between 18.5 and 24.9 You can calculate your ideal BMI at the NIH website JobEconomics.hu  If you have not heard anything from my staff in a week about any orders/referrals/studies from today, please contact us here to follow-up [phone]   Eustachian Tube Dysfunction The eustachian tube connects the middle ear to the back of the nose. It regulates air pressure in the middle ear by allowing air to move between the ear and nose. It also helps to drain fluid from the middle ear space. When the eustachian tube does not function properly, air pressure, fluid, or both can build up in the middle ear. Eustachian tube dysfunction can affect one or both ears. What are the causes? This condition happens when the eustachian tube becomes blocked or cannot open normally. This may result from:  Ear infections.  Colds and other upper respiratory infections.  Allergies.  Irritation, such as from cigarette smoke or acid from the stomach coming up into the esophagus (gastroesophageal reflux).  Sudden changes in air pressure, such as from descending in an airplane.  Abnormal growths in the nose or throat, such as nasal polyps, tumors, or enlarged tissue at the back of the throat  (adenoids).  What increases the risk? This condition may be more likely to develop in people who smoke and people who are overweight. Eustachian tube dysfunction may also be more likely to develop in children, especially children who have:  Certain birth defects of the mouth, such as cleft palate.  Large tonsils and adenoids.  What are the signs or symptoms? Symptoms of this condition may include:  A feeling of fullness in the ear.  Ear pain.  Clicking or popping noises in the ear.  Ringing in the ear.  Hearing loss.  Loss of balance.  Symptoms may get worse when the air pressure around you changes, such as when you travel to an area of high elevation or fly on an airplane. How is this diagnosed? This condition may be diagnosed based on:  Your symptoms.  A physical exam of your ear, nose, and throat.  Tests, such as those that measure: ? The movement of your eardrum (tympanogram). ? Your hearing (audiometry).  How is this treated? Treatment depends on the cause and severity of your condition. If your symptoms are mild, you may be able to relieve your symptoms by moving air into ("popping") your ears. If you have symptoms of fluid in your ears, treatment may include:  Decongestants.  Antihistamines.  Nasal sprays or ear drops that contain medicines that reduce swelling (steroids).  In some cases, you may need to have a procedure to drain the fluid in your eardrum (myringotomy). In this procedure, a small tube is placed in the eardrum to:  Drain the fluid.  Restore the air in the middle ear space.  Follow these instructions  at home:  Take over-the-counter and prescription medicines only as told by your health care provider.  Use techniques to help pop your ears as recommended by your health care provider. These may include: ? Chewing gum. ? Yawning. ? Frequent, forceful swallowing. ? Closing your mouth, holding your nose closed, and gently blowing as if you  are trying to blow air out of your nose.  Do not do any of the following until your health care provider approves: ? Travel to high altitudes. ? Fly in airplanes. ? Work in a Estate agent or room. ? Scuba dive.  Keep your ears dry. Dry your ears completely after showering or bathing.  Do not smoke.  Keep all follow-up visits as told by your health care provider. This is important. Contact a health care provider if:  Your symptoms do not go away after treatment.  Your symptoms come back after treatment.  You are unable to pop your ears.  You have: ? A fever. ? Pain in your ear. ? Pain in your head or neck. ? Fluid draining from your ear.  Your hearing suddenly changes.  You become very dizzy.  You lose your balance. This information is not intended to replace advice given to you by your health care provider. Make sure you discuss any questions you have with your health care provider. Document Released: 03/14/2015 Document Revised: 07/24/2015 Document Reviewed: 03/06/2014 Elsevier Interactive Patient Education  2018 [employer].  Preventing Unhealthy [employer], Adult Staying at a healthy weight is important. When fat builds up in your body, you may become overweight or obese. These conditions put you at greater risk for developing certain health problems, such as heart disease, diabetes, sleeping problems, joint problems, and some cancers. Unhealthy weight gain is often the result of making unhealthy choices in what you eat. It is also a result of not getting enough exercise. You can make changes to your lifestyle to prevent obesity and stay as healthy as possible. What nutrition changes can be made? To maintain a healthy weight and prevent obesity:  Eat only as much as your body needs. To do this: ? Pay attention to signs that you are hungry or full. Stop eating as soon as you feel full. ? If you feel hungry, try drinking water first. Drink enough water so your  urine is clear or pale yellow. ? Eat smaller portions. ? Look at serving sizes on food labels. Most foods contain more than one serving per container. ? Eat the recommended amount of calories for your gender and activity level. While most active people should eat around 2,000 calories per day, if you are trying to lose weight or are not very active, you main need to eat less calories. Talk to your health care provider or dietitian about how many calories you should eat each day.  Choose healthy foods, such as: ? Fruits and vegetables. Try to fill at least half of your plate at each meal with fruits and vegetables. ? Whole grains, such as whole wheat bread, brown rice, and quinoa. ? Lean meats, such as chicken or fish. ? Other healthy proteins, such as beans, eggs, or tofu. ? Healthy fats, such as nuts, seeds, fatty fish, and olive oil. ? Low-fat or fat-free dairy.  Check food labels and avoid food and drinks that: ? Are high in calories. ? Have added sugar. ? Are high in sodium. ? Have saturated fats or trans fats.  Limit how much you eat of the following foods: ?  Prepackaged meals. ? Fast food. ? Fried foods. ? Processed meat, such as bacon, sausage, and deli meats. ? Fatty cuts of red meat and poultry with skin.  Cook foods in healthier ways, such as by baking, broiling, or grilling.  When grocery shopping, try to shop around the outside of the store. This helps you buy mostly fresh foods and avoid canned and prepackaged foods.  What lifestyle changes can be made?  Exercise at least 30 minutes 5 or more days each week. Exercising includes brisk walking, yard work, biking, running, swimming, and team sports like basketball and soccer. Ask your health care provider which exercises are safe for you.  Do not use any products that contain nicotine or tobacco, such as cigarettes and e-cigarettes. If you need help quitting, ask your health care provider.  Limit alcohol intake to no  more than 1 drink a day for nonpregnant women and 2 drinks a day for men. One drink equals 12 oz of beer, 5 oz of wine, or 1 oz of hard liquor.  Try to get 7-9 hours of sleep each night. What other changes can be made?  Keep a food and activity journal to keep track of: ? What you ate and how many calories you had. Remember to count sauces, dressings, and side dishes. ? Whether you were active, and what exercises you did. ? Your calorie, weight, and activity goals.  Check your weight regularly. Track any changes. If you notice you have gained weight, make changes to your diet or activity routine.  Avoid taking weight-loss medicines or supplements. Talk to your health care provider before starting any new medicine or supplement.  Talk to your health care provider before trying any new diet or exercise plan. Why are these changes important? Eating healthy, staying active, and having healthy habits not only help prevent obesity, they also:  Help you to manage stress and emotions.  Help you to connect with friends and family.  Improve your self-esteem.  Improve your sleep.  Prevent long-term health problems.  What can happen if changes are not made? Being obese or overweight can cause you to develop joint or bone problems, which can make it hard for you to stay active or do activities you enjoy. Being obese or overweight also puts stress on your heart and lungs and can lead to health problems like diabetes, heart disease, and some cancers. Where to find more information: Talk with your health care provider or a dietitian about healthy eating and healthy lifestyle choices. You may also find other information through these resources:  U.S. Department of Agriculture MyPlate: https://ball-collins.biz/  American Heart Association: www.heart.org  Centers for Disease Control and Prevention: FootballExhibition.com.br  Summary  Staying at a healthy weight is important. It helps prevent certain diseases  and health problems, such as heart disease, diabetes, joint problems, sleep disorders, and some cancers.  Being obese or overweight can cause you to develop joint or bone problems, which can make it hard for you to stay active or do activities you enjoy.  You can prevent unhealthy weight gain by eating a healthy diet, exercising regularly, not smoking, limiting alcohol, and getting enough sleep.  Talk with your health care provider or a dietitian for guidance about healthy eating and healthy lifestyle choices. This information is not intended to replace advice given to you by your health care provider. Make sure you discuss any questions you have with your health care provider. Document Released: 02/17/2016 Document Revised: 03/24/2016 Document Reviewed: 03/24/2016  Elsevier Interactive Patient Education  2018 Elsevier Inc.  

## 2018-01-02 NOTE — Assessment & Plan Note (Signed)
Offered to check thyroid tests; see AVS for encouragement for weight loss

## 2018-01-02 NOTE — Progress Notes (Signed)
BP 118/78   Pulse 99   Temp 98.4 F (36.9 C) (Oral)   Ht 5\' 3"  (1.6 m)   Wt 217 lb 11.2 oz (98.7 kg)   LMP 12/13/2017   SpO2 98%   BMI 38.56 kg/m    Subjective:    Patient ID: Stephanie Kane, female    DOB: 1980/12/06, 37 y.o.   MRN: [id number]  HPI: Stephanie Kane is a 37 y.o. female  Chief Complaint  Patient presents with  . Dizziness    goes through spells    HPI  She has been dizzy a few times; other members of her household were sick; no chest pain; does get white coat anxiety at times; heart rate went to 128 and then down to 99; she says she and the kids got sick with something; one child was vomiting; patient felt horrible for 3 days, dizzy to the point that she didn't drive the kids to school; felt like she was going to throw up because she was dizzy; this was maybe 3-4 weeks ago, maybe longer; did have some ear problems, "I was freaking out" and started chewing gum; was hoping that instead of a blockage in her neck, was hoping it was inner ear or eustachian tubes; taking allergy pill every day but still snotty  IUD was removed and her anxiety improved; seeing GYN for that, [facility] nurse, Dr. Nicholaus Bloom; nothing right now for contraception; nothing needed  Patient has gained weight since last visit, about 30 pounds per our scales; she actually lost 15 pounds after removing IUD; so she has gained 45-50 pounds; was drinking beer in the afternoon and cut that out; trying to exercise; no hx of thyroid trouble personally; maternal aunt has had thyroid trouble; heart rate not slowing; no constipation  Anxiety; quit taking buspar  Depression screen [facility] 2/9 01/02/2018 08/04/2015 06/19/2015 03/20/2015 02/28/2015  Decreased Interest 0 0 0 0 0  Down, Depressed, Hopeless 0 1 0 1 0  PHQ - 2 Score 0 1 0 1 0  Altered sleeping 0 - - - -  Tired, decreased energy 0 - - - -  Change in appetite 0 - - - -  Feeling bad or failure about yourself  0 - - - -  Trouble concentrating  0 - - - -  Moving slowly or fidgety/restless 0 - - - -  Suicidal thoughts 0 - - - -  PHQ-9 Score 0 - - - -  Difficult doing work/chores Not difficult at all - - - -   Fall Risk  01/02/2018 08/04/2015 06/19/2015  Falls in the past year? 0 No No  Number falls in past yr: 0 - -   Relevant past medical, surgical, family and social history reviewed Past Medical History:  Diagnosis Date  . ADD (attention deficit disorder)   . Allergic rhinitis 08/24/2015  . Allergy   . Anxiety   . Depression   . Pap smear abnormality of cervix with ASCUS favoring benign    Neg HPV 04/2009   History reviewed. No pertinent surgical history. Family History  Problem Relation Age of Onset  . Hypertension Maternal Grandmother   . Heart disease Sister    Social History   Tobacco Use  . Smoking status: Former Smoker    Types: Cigarettes    Last attempt to quit: 01/07/2000    Years since quitting: 18.0  . Smokeless tobacco: Never Used  Substance Use Topics  . Alcohol use: Yes  Comment: occasionally  . Drug use: No     Office Visit from 01/02/2018 in [facility]  AUDIT-C Score  4      Interim medical history since last visit reviewed. Allergies and medications reviewed  Review of Systems Per HPI unless specifically indicated above     Objective:    BP 118/78   Pulse 99   Temp 98.4 F (36.9 C) (Oral)   Ht 5\' 3"  (1.6 m)   Wt 217 lb 11.2 oz (98.7 kg)   LMP 12/13/2017   SpO2 98%   BMI 38.56 kg/m   Wt Readings from Last 3 Encounters:  01/02/18 217 lb 11.2 oz (98.7 kg)  08/04/15 185 lb (83.9 kg)  06/19/15 188 lb 9.6 oz (85.5 kg)    Physical Exam  Constitutional: She appears well-developed and well-nourished. No distress.  Obese, weight gain noted  HENT:  Head: Normocephalic and atraumatic.  Right Ear: Ear canal normal. A middle ear effusion is present.  Left Ear: Tympanic membrane and ear canal normal.  No middle ear effusion.  Eyes: EOM are normal. No scleral  icterus.  Neck: No thyromegaly present.  Cardiovascular: Normal rate, regular rhythm and normal heart sounds.  No murmur heard. Pulmonary/Chest: Effort normal and breath sounds normal. No respiratory distress. She has no wheezes.  Abdominal: Soft. Bowel sounds are normal. She exhibits no distension.  Musculoskeletal: She exhibits no edema.  Neurological: She is alert.  Skin: Skin is warm and dry. She is not diaphoretic. No pallor.  Psychiatric: She has a normal mood and affect. Her behavior is normal. Judgment and thought content normal.    Results for orders placed or performed in visit on 08/04/15  POCT urinalysis dipstick  Result Value Ref Range   Color, UA light yellow    Clarity, UA clear    Glucose, UA neg    Bilirubin, UA neg    Ketones, UA neg    Spec Grav, UA 1.015    Blood, UA neg    pH, UA 5.5    Protein, UA neg    Urobilinogen, UA 0.2    Nitrite, UA neg    Leukocytes, UA Negative Negative      Assessment & Plan:   Problem List Items Addressed This Visit      Respiratory   Allergic rhinitis    Add singulair to the antihistamine during peak seasons; avoid triggers        Other   Obesity (BMI 35.0-39.9 without comorbidity)    Offered to check thyroid tests; see AVS for encouragement for weight loss      Anxiety    Patient doing well without meds       Other Visit Diagnoses    Eustachian tube disorder, right    -  Primary   likely cause of her dizziness, recent illness; trial of singulair; call if returns   Weight gain           Follow up plan: Return in about 2 months (around 03/14/2018) for complete physical.  An after-visit summary was printed and given to the patient at check-out.  Please see the patient instructions which may contain other information and recommendations beyond what is mentioned above in the assessment and plan.  Meds ordered this encounter  Medications  . DISCONTD: montelukast (SINGULAIR) 10 MG tablet    Sig: Take 1 tablet  (10 mg total) by mouth at bedtime. For allergies; okay to use during peak seasons    Dispense:  30 tablet    Refill:  11    No orders of the defined types were placed in this encounter.

## 2018-01-02 NOTE — Assessment & Plan Note (Addendum)
Add singulair to the antihistamine during peak seasons; avoid triggers

## 2018-01-05 NOTE — Assessment & Plan Note (Signed)
Patient doing well without meds

## 2018-03-07 ENCOUNTER — Encounter: Payer: Self-pay | Admitting: Family Medicine
